# Patient Record
Sex: Male | Born: 1945
Health system: Southern US, Community
[De-identification: ages and names within clinical notes are randomized; demographics above are authoritative.]

## PROBLEM LIST (undated history)

## (undated) DIAGNOSIS — I2510 Atherosclerotic heart disease of native coronary artery without angina pectoris: Secondary | ICD-10-CM

## (undated) DIAGNOSIS — I255 Ischemic cardiomyopathy: Secondary | ICD-10-CM

## (undated) DIAGNOSIS — E119 Type 2 diabetes mellitus without complications: Secondary | ICD-10-CM

## (undated) DIAGNOSIS — I951 Orthostatic hypotension: Secondary | ICD-10-CM

## (undated) DIAGNOSIS — M542 Cervicalgia: Secondary | ICD-10-CM

## (undated) DIAGNOSIS — G8929 Other chronic pain: Secondary | ICD-10-CM

## (undated) DIAGNOSIS — I219 Acute myocardial infarction, unspecified: Secondary | ICD-10-CM

## (undated) DIAGNOSIS — E785 Hyperlipidemia, unspecified: Secondary | ICD-10-CM

## (undated) HISTORY — PX: CERVICAL SPINE SURGERY: SHX589

## (undated) HISTORY — DX: Orthostatic hypotension: I95.1

## (undated) HISTORY — DX: Atherosclerotic heart disease of native coronary artery without angina pectoris: I25.10

## (undated) HISTORY — DX: Hyperlipidemia, unspecified: E78.5

## (undated) HISTORY — DX: Type 2 diabetes mellitus without complications: E11.9

## (undated) HISTORY — DX: Ischemic cardiomyopathy: I25.5

## (undated) HISTORY — DX: Acute myocardial infarction, unspecified: I21.9

---

## 2002-05-31 ENCOUNTER — Ambulatory Visit (HOSPITAL_COMMUNITY): Admission: RE | Admit: 2002-05-31 | Discharge: 2002-05-31 | Payer: Self-pay | Admitting: Interventional Cardiology

## 2002-12-12 ENCOUNTER — Emergency Department (HOSPITAL_COMMUNITY): Admission: EM | Admit: 2002-12-12 | Discharge: 2002-12-12 | Payer: Self-pay | Admitting: Emergency Medicine

## 2002-12-12 ENCOUNTER — Encounter: Payer: Self-pay | Admitting: Emergency Medicine

## 2003-03-15 ENCOUNTER — Ambulatory Visit (HOSPITAL_COMMUNITY): Admission: RE | Admit: 2003-03-15 | Discharge: 2003-03-15 | Payer: Self-pay | Admitting: Gastroenterology

## 2003-03-15 ENCOUNTER — Encounter (INDEPENDENT_AMBULATORY_CARE_PROVIDER_SITE_OTHER): Payer: Self-pay | Admitting: Specialist

## 2003-05-30 ENCOUNTER — Encounter: Payer: Self-pay | Admitting: Emergency Medicine

## 2003-05-30 ENCOUNTER — Emergency Department (HOSPITAL_COMMUNITY): Admission: EM | Admit: 2003-05-30 | Discharge: 2003-05-30 | Payer: Self-pay | Admitting: Emergency Medicine

## 2003-10-03 ENCOUNTER — Ambulatory Visit (HOSPITAL_COMMUNITY): Admission: RE | Admit: 2003-10-03 | Discharge: 2003-10-03 | Payer: Self-pay | Admitting: Cardiology

## 2003-10-12 ENCOUNTER — Ambulatory Visit (HOSPITAL_COMMUNITY): Admission: RE | Admit: 2003-10-12 | Discharge: 2003-10-13 | Payer: Self-pay | Admitting: Cardiology

## 2004-07-03 ENCOUNTER — Ambulatory Visit (HOSPITAL_COMMUNITY): Admission: RE | Admit: 2004-07-03 | Discharge: 2004-07-04 | Payer: Self-pay | Admitting: Cardiology

## 2006-02-15 ENCOUNTER — Encounter: Admission: RE | Admit: 2006-02-15 | Discharge: 2006-02-15 | Payer: Self-pay | Admitting: Cardiology

## 2006-02-22 ENCOUNTER — Ambulatory Visit (HOSPITAL_COMMUNITY): Admission: RE | Admit: 2006-02-22 | Discharge: 2006-02-22 | Payer: Self-pay | Admitting: Cardiology

## 2007-04-01 ENCOUNTER — Encounter: Admission: RE | Admit: 2007-04-01 | Discharge: 2007-04-01 | Payer: Self-pay | Admitting: Cardiology

## 2007-04-06 ENCOUNTER — Ambulatory Visit (HOSPITAL_COMMUNITY): Admission: RE | Admit: 2007-04-06 | Discharge: 2007-04-06 | Payer: Self-pay | Admitting: Interventional Cardiology

## 2007-08-14 ENCOUNTER — Inpatient Hospital Stay (HOSPITAL_COMMUNITY): Admission: EM | Admit: 2007-08-14 | Discharge: 2007-08-17 | Payer: Self-pay | Admitting: Emergency Medicine

## 2007-08-14 ENCOUNTER — Ambulatory Visit: Payer: Self-pay | Admitting: *Deleted

## 2007-08-16 ENCOUNTER — Encounter: Payer: Self-pay | Admitting: Cardiovascular Disease

## 2007-10-11 ENCOUNTER — Ambulatory Visit: Payer: Self-pay | Admitting: Cardiovascular Disease

## 2007-10-13 ENCOUNTER — Ambulatory Visit: Payer: Self-pay | Admitting: Cardiovascular Disease

## 2007-10-13 ENCOUNTER — Ambulatory Visit: Payer: Self-pay

## 2007-11-09 ENCOUNTER — Ambulatory Visit: Payer: Self-pay | Admitting: Cardiovascular Disease

## 2008-02-14 ENCOUNTER — Ambulatory Visit: Payer: Self-pay | Admitting: Cardiovascular Disease

## 2008-04-27 ENCOUNTER — Ambulatory Visit: Payer: Self-pay | Admitting: Cardiovascular Disease

## 2008-04-27 LAB — CONVERTED CEMR LAB
ALT: 40 units/L (ref 0–53)
AST: 28 units/L (ref 0–37)
Alkaline Phosphatase: 55 units/L (ref 39–117)
Bilirubin, Direct: 0.1 mg/dL (ref 0.0–0.3)
Cholesterol: 105 mg/dL (ref 0–200)
Total Bilirubin: 0.8 mg/dL (ref 0.3–1.2)
Total Protein: 7.2 g/dL (ref 6.0–8.3)
VLDL: 10 mg/dL (ref 0–40)

## 2008-05-29 ENCOUNTER — Ambulatory Visit: Payer: Self-pay | Admitting: Cardiovascular Disease

## 2008-10-02 ENCOUNTER — Ambulatory Visit: Payer: Self-pay | Admitting: Cardiovascular Disease

## 2008-10-02 LAB — CONVERTED CEMR LAB
Albumin: 4.6 g/dL (ref 3.5–5.2)
Alkaline Phosphatase: 67 units/L (ref 39–117)
BUN: 11 mg/dL (ref 6–23)
Bilirubin, Direct: 0.2 mg/dL (ref 0.0–0.3)
Calcium: 10 mg/dL (ref 8.4–10.5)
Eosinophils Absolute: 0.2 10*3/uL (ref 0.0–0.7)
GFR calc Af Amer: 97 mL/min
GFR calc non Af Amer: 80 mL/min
Glucose, Bld: 120 mg/dL — ABNORMAL HIGH (ref 70–99)
HCT: 44 % (ref 39.0–52.0)
Hemoglobin: 15.1 g/dL (ref 13.0–17.0)
MCHC: 34.4 g/dL (ref 30.0–36.0)
MCV: 96.2 fL (ref 78.0–100.0)
Monocytes Absolute: 0.4 10*3/uL (ref 0.1–1.0)
Monocytes Relative: 7.1 % (ref 3.0–12.0)
Neutro Abs: 3.9 10*3/uL (ref 1.4–7.7)
Platelets: 180 10*3/uL (ref 150–400)
Potassium: 5 meq/L (ref 3.5–5.1)
RDW: 12.2 % (ref 11.5–14.6)
Sodium: 141 meq/L (ref 135–145)
Total Protein: 7.4 g/dL (ref 6.0–8.3)

## 2008-10-04 ENCOUNTER — Ambulatory Visit: Payer: Self-pay | Admitting: Cardiovascular Disease

## 2008-10-04 ENCOUNTER — Inpatient Hospital Stay (HOSPITAL_BASED_OUTPATIENT_CLINIC_OR_DEPARTMENT_OTHER): Admission: RE | Admit: 2008-10-04 | Discharge: 2008-10-04 | Payer: Self-pay | Admitting: Cardiovascular Disease

## 2008-10-04 HISTORY — PX: CARDIAC CATHETERIZATION: SHX172

## 2008-10-23 ENCOUNTER — Ambulatory Visit: Payer: Self-pay | Admitting: Cardiovascular Disease

## 2009-01-30 ENCOUNTER — Encounter (INDEPENDENT_AMBULATORY_CARE_PROVIDER_SITE_OTHER): Payer: Self-pay | Admitting: *Deleted

## 2009-04-02 ENCOUNTER — Ambulatory Visit: Payer: Self-pay | Admitting: Cardiovascular Disease

## 2009-04-03 ENCOUNTER — Encounter: Payer: Self-pay | Admitting: Cardiovascular Disease

## 2009-04-29 ENCOUNTER — Ambulatory Visit: Payer: Self-pay | Admitting: Cardiovascular Disease

## 2009-04-29 DIAGNOSIS — I2510 Atherosclerotic heart disease of native coronary artery without angina pectoris: Secondary | ICD-10-CM | POA: Insufficient documentation

## 2009-04-29 DIAGNOSIS — E785 Hyperlipidemia, unspecified: Secondary | ICD-10-CM | POA: Insufficient documentation

## 2009-04-29 DIAGNOSIS — Z9861 Coronary angioplasty status: Secondary | ICD-10-CM

## 2009-07-02 ENCOUNTER — Encounter: Payer: Self-pay | Admitting: Cardiovascular Disease

## 2009-07-11 ENCOUNTER — Encounter (INDEPENDENT_AMBULATORY_CARE_PROVIDER_SITE_OTHER): Payer: Self-pay | Admitting: *Deleted

## 2009-07-12 ENCOUNTER — Encounter (INDEPENDENT_AMBULATORY_CARE_PROVIDER_SITE_OTHER): Payer: Self-pay | Admitting: *Deleted

## 2009-09-26 ENCOUNTER — Ambulatory Visit: Payer: Self-pay | Admitting: Cardiovascular Disease

## 2009-11-18 ENCOUNTER — Encounter: Payer: Self-pay | Admitting: Cardiovascular Disease

## 2010-01-29 ENCOUNTER — Telehealth: Payer: Self-pay | Admitting: Cardiovascular Disease

## 2010-01-29 ENCOUNTER — Ambulatory Visit: Payer: Self-pay | Admitting: Cardiology

## 2010-01-29 ENCOUNTER — Observation Stay (HOSPITAL_COMMUNITY): Admission: EM | Admit: 2010-01-29 | Discharge: 2010-01-30 | Payer: Self-pay | Admitting: Emergency Medicine

## 2010-02-24 ENCOUNTER — Encounter: Payer: Self-pay | Admitting: Cardiovascular Disease

## 2010-02-25 ENCOUNTER — Ambulatory Visit: Payer: Self-pay | Admitting: Cardiology

## 2010-02-25 ENCOUNTER — Ambulatory Visit: Payer: Self-pay

## 2010-02-25 ENCOUNTER — Ambulatory Visit (HOSPITAL_COMMUNITY): Admission: RE | Admit: 2010-02-25 | Discharge: 2010-02-25 | Payer: Self-pay | Admitting: Cardiovascular Disease

## 2010-02-25 ENCOUNTER — Ambulatory Visit: Payer: Self-pay | Admitting: Cardiovascular Disease

## 2010-03-26 ENCOUNTER — Ambulatory Visit: Payer: Self-pay | Admitting: Cardiovascular Disease

## 2010-04-15 ENCOUNTER — Encounter: Payer: Self-pay | Admitting: Cardiovascular Disease

## 2010-04-16 ENCOUNTER — Ambulatory Visit: Payer: Self-pay | Admitting: Cardiovascular Disease

## 2010-04-16 ENCOUNTER — Ambulatory Visit: Payer: Self-pay

## 2010-05-01 ENCOUNTER — Ambulatory Visit: Payer: Self-pay | Admitting: Cardiovascular Disease

## 2010-09-11 ENCOUNTER — Ambulatory Visit
Admission: RE | Admit: 2010-09-11 | Discharge: 2010-09-11 | Payer: Self-pay | Source: Home / Self Care | Attending: Cardiovascular Disease | Admitting: Cardiovascular Disease

## 2010-09-11 ENCOUNTER — Encounter: Payer: Self-pay | Admitting: Cardiovascular Disease

## 2010-09-20 ENCOUNTER — Encounter: Payer: Self-pay | Admitting: Family Medicine

## 2010-09-28 LAB — CONVERTED CEMR LAB
AST: 30 units/L (ref 0–37)
Albumin: 5 g/dL (ref 3.5–5.2)
Alkaline Phosphatase: 76 units/L (ref 39–117)
Cholesterol: 158 mg/dL (ref 0–200)
HDL: 36.7 mg/dL — ABNORMAL LOW (ref >39.00)
Total CHOL/HDL Ratio: 4
Total Protein: 8 g/dL (ref 6.0–8.3)
Triglycerides: 127 mg/dL (ref 0.0–149.0)
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
## 2010-09-30 NOTE — Miscellaneous (Signed)
  Clinical Lists Changes  Observations: Added new observation of CXR RESULTS:  Heart and mediastinal contours normal.  Lungs clear.   Osseous structures intact in one-view.    IMPRESSION:   No active disease in one-view.  (01/29/2010 10:49)      CXR  Procedure date:  01/29/2010  Findings:       Heart and mediastinal contours normal.  Lungs clear.   Osseous structures intact in one-view.    IMPRESSION:   No active disease in one-view.

## 2010-09-30 NOTE — Assessment & Plan Note (Signed)
Summary: EPH   Visit Type:  Post-hospital Primary Provider:  Joycelyn Rua, MD  CC:  No complaints.  History of Present Illness: 65 year-old male with CAD and hx MI secondary to RCA stent thrombosis. He was hospitalized recently with an episode of chest pain, but had normal cardiac markers and serial EKG's. He was observed overnight and discharged home the following day with outpatient followup today. He has felt well since discharge and denies CP, dyspnea, edema, or palps. He has been walking 3 miles daily at a brisk pace without exertional symptoms.  Current Medications (verified): 1)  Tra2p Study Drug .... Take One Tab Once Daily 2)  Niaspan 750 Mg Cr-Tabs (Niacin (Antihyperlipidemic)) .... Take 2 Tablets At Bed Time 3)  Aspirin 325 Mg Tabs (Aspirin) .Marland Kitchen.. 1 Once Daily 4)  Plavix 75 Mg Tabs (Clopidogrel Bisulfate) .Marland Kitchen.. 1 Once Daily 5)  Omega-3 350 Mg Caps (Omega-3 Fatty Acids) .Marland Kitchen.. 10-12 Grams Daily 6)  Zocor 40 Mg Tabs (Simvastatin) .... Take 1/2  Tablet By Mouth Once A Day  Allergies (verified): No Known Drug Allergies  Past History:  Past medical history reviewed for relevance to current acute and chronic problems.  Past Medical History: Reviewed history from 04/29/2009 and no changes required. Myocardial Infarction 09/11/06 CAD Hyperlipidemia  Review of Systems       Negative except as per HPI   Vital Signs:  Patient profile:   65 year old male Height:      72 inches Weight:      170.50 pounds BMI:     23.21 Pulse rate:   76 / minute Pulse rhythm:   regular Resp:     18 per minute BP sitting:   104 / 70  (left arm) Cuff size:   large  Vitals Entered By: Vikki Ports (February 25, 2010 11:20 AM)  Physical Exam  General:  Pt is alert and oriented, in no acute distress. HEENT: normal Neck: normal carotid upstrokes without bruits, JVP normal Lungs: CTA CV: RRR without murmur or gallop Abd: soft, NT, positive BS, no bruit, no organomegaly Ext: no clubbing,  cyanosis, or edema. peripheral pulses 2+ and equal Skin: warm and dry without rash    EKG  Procedure date:  02/25/2010  Findings:      NSR, within normal limits, HR 76 bpm.  Impression & Recommendations:  Problem # 1:  CORONARY ATHEROSCLEROSIS NATIVE CORONARY ARTERY (ICD-414.01) Pt stable without further episodes of chest pain. He had a similar episode last year and we did a relook cath showing stable coronary anatomy. Continue long-term dual antiplatelet Rx with ASA and plavix since he has history of late stent thrombosis. I applauded him for his excellent exercise regimen.  His updated medication list for this problem includes:    Aspirin 325 Mg Tabs (Aspirin) .Marland Kitchen... 1 once daily    Plavix 75 Mg Tabs (Clopidogrel bisulfate) .Marland Kitchen... 1 once daily  Orders: EKG w/ Interpretation (93000) TLB-Lipid Panel (80061-LIPID) TLB-Hepatic/Liver Function Pnl (80076-HEPATIC)  Problem # 2:  PURE HYPERCHOLESTEROLEMIA (ICD-272.0) WIll check lipids today. Simvastatin was reduced in the hospital because of concomitant Niaspan.  His updated medication list for this problem includes:    Niaspan 750 Mg Cr-tabs (Niacin (antihyperlipidemic)) .Marland Kitchen... Take 2 tablets at bed time    Zocor 40 Mg Tabs (Simvastatin) .Marland Kitchen... Take 1/2  tablet by mouth once a day  Orders: EKG w/ Interpretation (93000) TLB-Lipid Panel (80061-LIPID) TLB-Hepatic/Liver Function Pnl (80076-HEPATIC)  CHOL: 105 (04/27/2008)   LDL: 68 (04/27/2008)   HDL:  27.7 (04/27/2008)   TG: 49 (04/27/2008)  Patient Instructions: 1)  Your physician recommends that you have a LIPID and LIVER Profile today.  2)  Your physician recommends that you continue on your current medications as directed. Please refer to the Current Medication list given to you today. 3)  Your physician wants you to follow-up in:   6 MONTHS. You will receive a reminder letter in the mail two months in advance. If you don't receive a letter, please call our office to schedule the  follow-up appointment.

## 2010-09-30 NOTE — Letter (Signed)
Summary: [facility] Smart Care Consideration  [facility] Smart Care Consideration   Imported By: Roderic Ovens 02/04/2010 12:24:40  _____________________________________________________________________  External Attachment:    Type:   Image     Comment:   External Document

## 2010-09-30 NOTE — Miscellaneous (Signed)
Summary: Orders Update  Clinical Lists Changes  Orders: Added new Test order of Arterial Duplex Lower Extremity (Arterial Duplex Low) - Signed 

## 2010-10-02 NOTE — Assessment & Plan Note (Signed)
Summary: f42m   Visit Type:  Follow-up Primary Provider:  Joycelyn Rua, MD  CC:  No complaints.  History of Present Illness: 65 year-old male with CAD and hx MI secondary to RCA stent thrombosis. He has subsequently done well on continued dual antiplatelet Rx without recurrent events. He had a normal stress echo February 25, 2010.  From June 2011 through today, he has walked 603 miles, 3,086,578 steps with exercise. He walks about 75 minutes daily.  The patient denies chest pain, dyspnea, orthopnea, PND, edema, palpitations, lightheadedness, or syncope.   Current Medications (verified): 1)  Tra2p Study Drug .... Take One Tab Once Daily 2)  Niaspan 750 Mg Cr-Tabs (Niacin (Antihyperlipidemic)) .... Take 2 Tablets At Bed Time 3)  Aspirin 325 Mg Tabs (Aspirin) .Marland Kitchen.. 1 Once Daily 4)  Plavix 75 Mg Tabs (Clopidogrel Bisulfate) .Marland Kitchen.. 1 Once Daily 5)  Omega-3 350 Mg Caps (Omega-3 Fatty Acids) .Marland Kitchen.. 10-12 Grams Daily 6)  Zocor 40 Mg Tabs (Simvastatin) .... Take 1/2  Tablet By Mouth Once A Day  Allergies (verified): No Known Drug Allergies  Past History:  Past medical history reviewed for relevance to current acute and chronic problems.  Past Medical History: Reviewed history from 04/29/2009 and no changes required. Myocardial Infarction 09/11/06 CAD Hyperlipidemia  Review of Systems       Negative except as per HPI   Vital Signs:  Patient profile:   65 year old male Height:      72 inches Weight:      166.25 pounds BMI:     22.63 Pulse rate:   66 / minute Pulse rhythm:   regular Resp:     18 per minute BP sitting:   110 / 68  (left arm) Cuff size:   large  Vitals Entered By: Vikki Ports (September 11, 2010 10:36 AM)  Physical Exam  General:  Pt is alert and oriented, in no acute distress. HEENT: normal Neck: normal carotid upstrokes without bruits, JVP normal Lungs: CTA CV: RRR without murmur or gallop Abd: soft, NT, positive BS, no bruit, no organomegaly Ext: no  clubbing, cyanosis, or edema. peripheral pulses 2+ and equal Skin: warm and dry without rash    Stress Echocardiogram  Procedure date:  02/25/2010  Findings:      Study Conclusions  - Stress ECG conclusions: The stress ECG was normal. - Baseline: LV global systolic function was normal. The estimated LV   ejection fraction was 65%. Normal wall motion; no LV regional wall   motion abnormalities. - Peak stress: LV global systolic function was hyperdynamic. Normal   wall motion; no LV regional wall motion abnormalities. - Impressions: With stress there is increased thickening and   contractility in all segments. There is decreased cavity size.   This is a normal stress echo study. Impressions:  - With stress there is increased thickening and contractility in all   segments. There is decreased cavity size. This is a normal stress   echo study.  EKG  Procedure date:  02/25/2010  Findings:      NSR, within normal limits, HR 66 bpm.  Impression & Recommendations:  Problem # 1:  CORONARY ATHEROSCLEROSIS NATIVE CORONARY ARTERY (ICD-414.01)  Stable without angina at an excellent workload. Continue current medical program with lifelong plavix and risk reduction measures as outlined.  His updated medication list for this problem includes:    Aspirin 325 Mg Tabs (Aspirin) .Marland Kitchen... 1 once daily    Plavix 75 Mg Tabs (Clopidogrel bisulfate) .Marland Kitchen... 1 once  daily  Orders: EKG w/ Interpretation (93000)  Problem # 2:  PURE HYPERCHOLESTEROLEMIA (ICD-272.0)  Pt going to consider the REVEAL trial which looks at HDL raising therapy.  His updated medication list for this problem includes:    Niaspan 750 Mg Cr-tabs (Niacin (antihyperlipidemic)) .Marland Kitchen... Take 2 tablets at bed time    Zocor 40 Mg Tabs (Simvastatin) .Marland Kitchen... Take 1/2  tablet by mouth once a day  CHOL: 158 (02/25/2010)   LDL: 96 (02/25/2010)   HDL: 36.70 (02/25/2010)   TG: 127.0 (02/25/2010)  Orders: EKG w/ Interpretation  (93000)  Patient Instructions: 1)  Your physician recommends that you continue on your current medications as directed. Please refer to the Current Medication list given to you today. 2)  Your physician wants you to follow-up in:  1 YEAR.  You will receive a reminder letter in the mail two months in advance. If you don't receive a letter, please call our office to schedule the follow-up appointment.

## 2010-11-17 LAB — POCT I-STAT, CHEM 8
HCT: 41 % (ref 39.0–52.0)
Hemoglobin: 13.9 g/dL (ref 13.0–17.0)
Potassium: 4.4 mEq/L (ref 3.5–5.1)
Sodium: 137 mEq/L (ref 135–145)
TCO2: 24 mmol/L (ref 0–100)

## 2010-11-17 LAB — COMPREHENSIVE METABOLIC PANEL
ALT: 33 U/L (ref 0–53)
Albumin: 3.9 g/dL (ref 3.5–5.2)
Alkaline Phosphatase: 66 U/L (ref 39–117)
BUN: 11 mg/dL (ref 6–23)
Calcium: 9.2 mg/dL (ref 8.4–10.5)
Potassium: 4.6 mEq/L (ref 3.5–5.1)
Sodium: 141 mEq/L (ref 135–145)
Total Protein: 6.4 g/dL (ref 6.0–8.3)

## 2010-11-17 LAB — POCT CARDIAC MARKERS
CKMB, poc: 1.2 ng/mL (ref 1.0–8.0)
Troponin i, poc: <0.05 ng/mL (ref 0.00–0.09)

## 2010-11-17 LAB — CBC
MCHC: 34.5 g/dL (ref 30.0–36.0)
MCV: 98.2 fL (ref 78.0–100.0)
Platelets: 174 10*3/uL (ref 150–400)
RDW: 12.9 % (ref 11.5–15.5)

## 2010-11-17 LAB — CARDIAC PANEL(CRET KIN+CKTOT+MB+TROPI)
CK, MB: 1.8 ng/mL (ref 0.3–4.0)
Relative Index: INVALID (ref 0.0–2.5)
Total CK: 82 U/L (ref 7–232)

## 2010-11-17 LAB — TSH: TSH: 3.997 u[IU]/mL (ref 0.350–4.500)

## 2010-11-17 LAB — DIFFERENTIAL
Basophils Relative: 1 % (ref 0–1)
Eosinophils Absolute: 0.1 10*3/uL (ref 0.0–0.7)
Neutrophils Relative %: 79 % — ABNORMAL HIGH (ref 43–77)

## 2010-11-17 LAB — LIPID PANEL
HDL: 28 mg/dL — ABNORMAL LOW (ref >39)
LDL Cholesterol: 58 mg/dL (ref 0–99)
Triglycerides: 112 mg/dL (ref <150)

## 2010-11-17 LAB — HEMOGLOBIN A1C: Mean Plasma Glucose: 134 mg/dL — ABNORMAL HIGH (ref <117)

## 2010-11-24 ENCOUNTER — Other Ambulatory Visit: Payer: Self-pay | Admitting: Cardiovascular Disease

## 2010-11-27 NOTE — Telephone Encounter (Signed)
[address] °

## 2010-11-29 ENCOUNTER — Other Ambulatory Visit: Payer: Self-pay | Admitting: *Deleted

## 2010-11-29 MED ORDER — SIMVASTATIN 40 MG PO TABS: 40.0000 mg | ORAL_TABLET | Freq: Every day | ORAL | Status: DC

## 2010-12-29 ENCOUNTER — Telehealth: Payer: Self-pay | Admitting: Cardiovascular Disease

## 2010-12-29 NOTE — Telephone Encounter (Signed)
Pt needs to know what he needs to do re his plavix pt is waiting to have dental work done.

## 2010-12-29 NOTE — Telephone Encounter (Signed)
I spoke with the pt and made him aware that we do not hold plavix prior to dental work.  The pt is trying to  schedule a dental extraction.  The pt saw the dentist last week on Thursday and per the pt paperwork was faxed to our office.  I made the pt aware that at this time I have not received any documentation.  The pt can proceed with extraction and can continue plavix.

## 2010-12-31 ENCOUNTER — Telehealth: Payer: Self-pay

## 2010-12-31 NOTE — Telephone Encounter (Signed)
With the patient's history of stent thrombosis I would recommend staying on both aspirin and Plavix without interruption for dental extraction.

## 2010-12-31 NOTE — Telephone Encounter (Signed)
I spoke with Angelica Chessman at Dr Mendel Corning office. They need to perform a dental extraction of lower molar.  This pt is taking both ASA and Plavix.  They would like to know if the pt can hold one of these medications and how long to hold per Dr Excell Seltzer.  They need note faxed to [phone]. I will forward this message to Dr Excell Seltzer.

## 2010-12-31 NOTE — Telephone Encounter (Signed)
Telephone note faxed

## 2011-01-05 ENCOUNTER — Telehealth: Payer: Self-pay | Admitting: Cardiology

## 2011-01-05 NOTE — Telephone Encounter (Signed)
This is Dr Cooper's patient, not Dr Alford Highland. 12/31/10 telephone note refaxed.

## 2011-01-05 NOTE — Telephone Encounter (Signed)
Please re-fax clearance over to fax # [phone]. Surgery on tomorrow.

## 2011-01-13 NOTE — Cardiovascular Report (Signed)
NAMEMORRILL, BOMKAMP NO.:  [id number]   MEDICAL RECORD NO.:  [id number]          PATIENT TYPE:  OIB   LOCATION:  2859                         FACILITY:  [facility]   PHYSICIAN:  Corky Crafts, MDDATE OF BIRTH:  [birth year]   DATE OF PROCEDURE:  DATE OF DISCHARGE:                            CARDIAC CATHETERIZATION   PROCEDURES:  Left heart catheterization, left ventriculogram, coronary  angiogram, abdominal aortogram.   OPERATOR:  Dr. Eldridge Dace   INDICATIONS:  Abnormal stress test and chest pain.   PROCEDURE:  The risks and benefits of cardiac catheterization were  explained to the patient and informed consent was obtained.  The patient  was brought to the cath lab.  He was prepped and draped in the usual  sterile fashion.  His right groin was infiltrated with 1% lidocaine.  A  6-French arterial sheath was placed into his right femoral artery using  modified Seldinger technique.  Left coronary artery angiography was  attempted with a JL-4.0 catheter.  Upon engaging the left main, pressure  damping was evident, a nonselective shot was taken showing no stenosis.  Then the right coronary was then visualized using a JR-4.0 catheter.  The catheter was then advanced to the vessel of the ostium under  fluoroscopic guidance.  Digital angiography was performed in multiple  projections using hand injection contrast.  The left system was then  visualized with a JL-4.0 guiding catheter with side holes.  The pressure  remained more stable.  The patient did not have any symptoms.  A pigtail  catheter was then advanced to the ascending aorta and across the aortic  valve into the left ventricle.  Power injection of contrast was  performed in the RAO projection.  The catheter was then pulled back  under continuous hemodynamic pressure monitoring.  The catheter was  pulled back to the abdominal aorta and a power injection of contrast was  performed in the AP projection.  The  sheath was removed using a Star  close to obtain hemostasis.   FINDINGS:  Left main was widely patent.  The left circumflex was a  medium-sized vessel which was widely patent.  The OM-1 was medium-sized  mild irregularities.  OM-2 was small but patent.  The left anterior  descending was a large vessel with mild luminal irregularities.  The  first diagonal was a large vessel with mild irregularities.  The second  diagonal was a small vessel and widely patent.  The right coronary  artery was a large dominant vessel.  There is a widely patent proximal  to mid RCA stent.  There is a large PDA in which the proximal portion is  stented.  There is a 50% focal area of in-stent restenosis.  The PDA  that does go on to supply blood flow to the apex.  The left  ventriculogram shows normal ventricular function with ejection fraction  of 60%.  The abdominal aortogram shows no abdominal aortic aneurysm.  There are single renal arteries bilaterally which appear widely patent.   HEMODYNAMIC RESULTS:  Left ventricular pressure of 113/3 with an LVEDP  of 9 mmHg.  Aortic pressure 113/67 with mean aortic pressure of 87 mmHg.   IMPRESSION:  1. Patent left coronary system, patent right coronary artery stent,      patent PDA stent with a focal area of 50% restenosis.  This appears      unchanged from the prior catheterization.  2. Normal ventricular contraction.  3. Normal hemodynamics.  4. No renal artery stenosis.  No abdominal aortic aneurysm.   RECOMMENDATIONS:  The patient will continue with aspirin and statin,  along with other secondary prevention.  He will follow up with Dr.  Mayford Knife.      Corky Crafts, MD  Electronically Signed     JSV/MEDQ  D:  04/06/2007  T:  04/06/2007  Job:  [id number]

## 2011-01-13 NOTE — Cardiovascular Report (Signed)
[name], Jack NO.:  [id number]   MEDICAL RECORD NO.:  [id number]          PATIENT TYPE:  OIB   LOCATION:  1962                         FACILITY:  [facility]   PHYSICIAN:  Veverly Fells. Excell Seltzer, MD  DATE OF BIRTH:  04-22-1946   DATE OF PROCEDURE:  10/04/2008  DATE OF DISCHARGE:  10/04/2008                            CARDIAC CATHETERIZATION   PROCEDURES:  1. Left heart catheterization.  2. Selective coronary angiography.  3. Left ventricular angiography.   INDICATION:  [name] is a 65 year old gentleman with coronary artery  disease.  He has undergone prior stenting of the right coronary artery  and right PDA.  He presented with stent thrombosis in December 2008 and  was treated with balloon angioplasty.  He has been followed since that  time and has done well.  However, he presented earlier this week with  typical symptoms of marked exertional angina.  He was referred for  cardiac catheterization because of his high pretest probability.   Risks and indications of the procedure were reviewed with the patient.  Informed consent was obtained.  The right groin was prepped, draped, and  anesthetized with 1% lidocaine.  Using the modified Seldinger technique,  a 4-French sheath was placed in the right femoral artery.  Standard 4-  French Judkins catheters were used for coronary angiography and left  ventriculography.  A pullback across the aortic valve was performed.  The patient tolerated the procedure well.  There were no immediate  complications.  All catheter exchanges were performed over a guidewire.   FINDINGS:  Aortic pressure 100/57 with a mean of 74, left ventricular  pressure 101/17.   CORONARY ANGIOGRAPHY:  Left mainstem:  The left mainstem is small.  It  has minor ostial stenosis of approximately 30%.  The left main divides  into the LAD and left circumflex.   LAD:  The LAD is a moderate-sized vessel.  It courses down and reaches  the LV apex.  There  is minor nonobstructive plaque in the mid vessel  producing no more than a 30% stenosis.  The first diagonal branch is  relatively large diagonal.  There is minor luminal irregularity but no  significant stenosis present.  There is mild bridging of the mid LAD.   Left circumflex:  The left circumflex supplies an intermediate branch  with no significant stenosis.  It then courses down and supplies a small  OM-1 branch and very small OM-2 and 3 branches.  There is no significant  stenosis in the left circumflex.   Right coronary artery:  The right coronary artery has overlapping stents  in the proximal vessel.  They are widely patent.  The proximal stented  segment has a small aneurysmal area.  The midportion of the stented  segment has mild 25% restenosis.  This appears smooth.  There are no  filling defects in the stent.  Beyond the stented segment, there is  nonobstructive plaque noted in the mid vessel.  The right coronary  artery then courses down and divides into a PDA branch and a large  posterolateral branch.  There is a stent in the PDA branch that is  patent.  There is a focal area of 50% restenosis in the PDA.  This was  compared to previous studies and it is unchanged.  The remaining  portions of the PDA are widely patent.  The posterior AV segment gives  off a large first posterolateral and a small second posterolateral  branch.  There is nonobstructive plaque in the posterior AV segment and  no significant stenosis in the posterolateral branch.   Left ventriculography shows normal LV function, EF is 60% by visual  estimate.   ASSESSMENT:  1. Widely patent proximal right coronary artery stent.  2. Mild to moderate restenosis in the right posterior descending      artery stent.  3. Nonobstructive left coronary artery stenosis.  4. Normal left ventricular function.   PLAN:  [name] has stable anatomy.  I am pleased that the stents in  the right coronary artery remain  patent.  I recommend continued medical  management of his coronary artery disease.      Veverly Fells. Excell Seltzer, MD  Electronically Signed     MDC/MEDQ  D:  10/04/2008  T:  10/04/2008  Job:  [id number]   cc:   Joycelyn Rua, M.D.

## 2011-01-13 NOTE — Assessment & Plan Note (Signed)
[facility]                            CARDIOLOGY OFFICE NOTE   NAME:[name], [name]                         MRN:          [id number]  DATE:10/02/2008                            DOB:          [birth year]    REASON FOR VISIT:  Chest pain.   HISTORY OF PRESENT ILLNESS:  Mr. Leighty is a 65 year old gentleman with  coronary artery disease who had an inferior wall MI approximately 2  years ago that occurred secondary to stent thrombosis.  He underwent  balloon angioplasty to reopen his right coronary artery and he has done  well since that time.  However, he reports a recent episode of profound  fatigue associated with chest discomfort.  It occurred while he was  carrying wood in from outside.  He became extremely fatigued and  developed chest discomfort as well as nausea.  He also had associated  shortness of breath.  He had to call his wife to help him back into the  house.  He has had no other similar episodes and he was doing fairly  vigorous work at that time.  He also describes pain under the left arm  that occurs mainly when he drives.  He was concerned that this could  represent heart pain as well.  When this occurs, he experiences numbness  in his fingertips.  He has complained of increased fatigue over the last  few months as well.  He also has increased exertional dyspnea over the  same time period.   MEDICATIONS:  1. Aspirin 325 mg daily.  2. Plavix 75 mg daily.  3. Zocor 80 mg at bedtime.  4. Omega-3 10-12 g daily.  5. Niaspan time-release 1500 mg daily.   ALLERGIES:  NKDA.   PAST MEDICAL HISTORY  1. Coronary artery disease as outlined.  He initially underwent      stenting of the right coronary artery in 2007 and subsequently      suffered an inferior wall MI in 2008 secondary to late stent      thrombosis.  2. Dyslipidemia.  3. Mild left ventricular dysfunction consistent with prior myocardial      infarction.   PHYSICAL EXAM:   GENERAL:  The patient is alert and oriented.  He is in  no acute distress.  VITAL SIGNS:  Weight is 177 pounds, blood pressure 123/78, heart rate  75, respiratory rate 12.  HEENT:  Normal.  NECK:  Normal carotid upstrokes.  No bruits.  JVP normal.  No  thyromegaly or thyroid nodules.  LUNGS:  Clear bilaterally.  HEART:  Regular rate and rhythm.  No murmurs or gallops.  ABDOMEN:  Soft, nontender, no organomegaly.  No abdominal bruits.  BACK:  No CVA tenderness.  EXTREMITIES:  No clubbing, cyanosis, or edema.  Peripheral pulses are  intact and equal throughout.  SKIN:  Warm and dry without rash.   EKG shows normal sinus rhythm with age-indeterminate inferolateral MI.  No significant ST or T-wave changes.   ASSESSMENT:  1. Coronary artery disease with history of inferior myocardial      infarction.  Mr. Ahn had a profound episode concerning for      myocardial ischemia.  I think his pretest probability of recurrent      obstructive coronary artery disease is high and I have recommended      proceeding directly with cardiac catheterization.  He is very      concerned about his cardiac status as well and he agrees with this      plan.  He was scheduled for Thursday, October 05, 2007, in the      outpatient cath lab.  He will continue his same medications in the      interim.  Risks and indications of the procedure were reviewed in      detail with the patient he agreed to proceed.  2. Dyslipidemia.  Lipids drawn, April 27, 2008, showed a cholesterol      of 105, triglycerides 49, HDL 28, LDL 68.  His LFTs were normal.      He will be due for followup later this summer.   Further plans for office followup pending the results of his cardiac  catheterization.     Veverly Fells. Excell Seltzer, MD  Electronically Signed    MDC/MedQ  DD: 10/02/2008  DT: 10/03/2008  Job #: [id number]   cc:   Joycelyn Rua, M.D.

## 2011-01-13 NOTE — Cardiovascular Report (Signed)
[name], [name] NO.:  [id number]   MEDICAL RECORD NO.:  [id number]          PATIENT TYPE:  INP   LOCATION:  2907                         FACILITY:  [facility]   PHYSICIAN:  Veverly Fells. Excell Seltzer, MD  DATE OF BIRTH:  Jan 02, 1946   DATE OF PROCEDURE:  08/14/2007  DATE OF DISCHARGE:                            CARDIAC CATHETERIZATION   PROCEDURES:  Left heart catheterization, selective coronary angiography,  left ventricular angiography, percutaneous transluminal coronary  angiography and intravascular ultrasound of the right coronary artery  and Angio-Seal of the right femoral artery.   INDICATIONS:  Mr. Levitz is a 65 year old gentleman who developed  substernal chest pain with generalized fatigue late this afternoon.  He  called EMS and upon their arrival, he had inferolateral ST elevation  consistent with myocardial infarction.  A code STEMI was called and he  was brought by [facility].  Mr. Murtagh has known coronary artery  disease and multiple drug-eluting stents in his right coronary artery  and right PDA branch.  His stents were placed back in 2005.  He actually  underwent a diagnostic catheterization in August of this year that  showed widely patent stents.  He stopped his Plavix this summer, but was  on Plavix for 3 years following his initial PCI procedure.  Risks and  indications of the procedure were reviewed with the patient.  Informed  consent was obtained.  He was brought emergently to the cardiac  catheterization lab.   The right groin was prepped and draped under normal sterile conditions.  A 6-French sheath was placed in the right femoral artery and a 5-French  sheath was placed in the right femoral vein.  A 5-French, JL-4  diagnostic catheter was used to image the left coronary artery.  A 6-  [place], JR-4 guide catheter was used for the right coronary artery.  The  right coronary artery was occluded in its proximal portion in the  stented  segment.  There was a 100% occlusion in that area.  The patient  had received heparin in the emergency room.  He was preloaded with 600  mg of Plavix.  His initial ACT was 216.  Angiomax was used for  anticoagulation.  The lesion was wired with a Cougar guidewire without  difficulty and the wire was placed into the distal right coronary  artery.  It was predilated with a 2.5 x 20-mm, Maverick balloon to 10  atmospheres.  Following balloon dilatation, there was restoration of  TIMI-3 flow in the vessel.  There was a heavy thrombus burden as well as  distal thrombus in the posterior AV segment.  At that point,  I elected  to add Integrilin.  A double bolus and drip were used.  In the setting  of late stent thrombosis, I elected to perform intravascular ultrasound  to make sure that there was good stent apposition throughout.  The IVUS  catheter was inserted and an automated pullback was performed.  IVUS  demonstrated good stent apposition throughout with the exception of one  area in the proximal portion of the stented segment  where there was a  small outpouching vessel.  There was heavy thrombus burden throughout  the midportion of the right coronary artery and a long segment of stent.  I then elected to dilate that entire region with a 3 x 20-mm, Quantum  Maverick balloon.  This was taken to 20 atmospheres on three inflations.  The patient had 2.75-mm, Taxus stents in that area and with IVUS, the  stents appeared to be approximately 3-mm in diameter.  Following  dilatation, there was improvement in the area, but still significant  residual plaque and thrombus.  Another IVUS run was made that showed  patency throughout the stent with the exception of the midportion which  showed residual stenosis.  I dilated that area with a 3.25 x 12-mm,  Quantum Maverick up to 20 atmospheres on two inflations.  At the  completion of the procedure, there was improvement in that area with  only 10% residual  stenosis.  There was TIMI-3 flow.  The area of the  filling defect in the posterior AV segment had resolved with Integrilin.  A pigtail catheter was inserted into the left ventricle and  ventriculography was performed and pullback across the aortic valve was  done.  An Angio-Seal device was used to seal the femoral arteriotomy.  The patient tolerated the entire procedure well.  He had complete ST-  segment resolution at the completion of the procedure and TIMI-3 flow.  He was chest pain-free.  He was transferred to the CCU in guarded  condition.   FINDINGS:  Aortic pressure 100/60 with a mean of 78, left ventricular  pressure 102/21.   CORONARY ANGIOGRAPHY:  The left mainstem has 30% ostial stenosis with  mild calcification.  The left mainstem bifurcates into the LAD and left  circumflex.  The LAD is a medium caliber vessel with diffuse  nonobstructive disease.  There are two proximal diagonal branches  arising from the LAD that are patent.  The first diagonal has a 50%  stenosis in its proximal portion.  The LAD itself at the area of the  second septal perforator in the midportion of the vessel has a focal 30%  stenosis.  There is minor plaque throughout the remaining portions of  the LAD.  The LAD does not quite reach the apex as it tapers down in the  distal anterior wall.   The left circumflex has diffuse nonobstructive plaque.  It is a small  vessel.  It supplies a small OM-1 and two small left posterolateral  branches.   The right coronary artery is occluded in its proximal portion within the  stented segment.  It appears to be a flush occlusion.  The right  coronary artery following PCI is a dominant vessel that courses down and  supplies a PDA branch as well as a posterior AV segment and two right  posterolateral branches.  There is a patent stent in the PDA with 50%  stenosis at the distal aspect.  There are luminal irregularities  throughout the posterior AV segment and  posterolateral branch.   Left ventricular function shows a large area of akinesis in the mid and  distal inferior wall into the inferior apex.  The entire anterior wall  and basal inferior wall function normally.   ASSESSMENT:  1. Acute inferolateral myocardial infarction secondary to very late      stent thrombosis in the right coronary artery.  2. Nonobstructive plaque in the left main left anterior descending and  left circumflex.  3. Moderate segmental left ventricular dysfunction with an left      ventricular ejection fraction of 45%.   DISCUSSION:  As detailed above, PTCA of the right coronary artery is  performed under guidance of intravascular ultrasound.  There was mild  residual stenosis in the mid-right coronary artery, but there is TIMI-3  flow in the vessel.  The vessel was dilated with a 3.25-mm, Quantum  Maverick balloon.  Distally, in the vessel, there was resolution of  filling defects with the use an Integrilin.  I would recommend lifelong  aspirin and Plavix.  Would consider twice-daily Plavix for the first 30  days at the discretion of Dr. Mayford Knife.  The patient will be transferred  to the CCU and treated with routine post MI medical therapy.      Veverly Fells. Excell Seltzer, MD  Electronically Signed     MDC/MEDQ  D:  08/14/2007  T:  08/15/2007  Job:  [id number]

## 2011-01-13 NOTE — Assessment & Plan Note (Signed)
[name]                            CARDIOLOGY OFFICE NOTE   NAME:[name]                         MRN:          [id number]  DATE:04/27/2008                            DOB:          07/03/1946    [name] was seen in followup at the [facility] on  April 27, 2008 for followup of coronary artery disease and history of  myocardial infarction.  He is a 65 year old gentleman who had an  inferior MI in December 2008, secondary to stent thrombosis.  He had  undergone stenting of the right coronary artery back in 2005 with a drug-  eluting stent and had done well since that time.  He was off of Plavix  for approximately 6 months when his event occurred.  He was treated with  twice daily Plavix after his MI and now is back on aspirin and Plavix  and in conjunction.  He has no complaints today.  He feels well.  He  specifically denies chest pain, dyspnea, edema, lightheadedness,  palpitations, or fatigue.  He was tolerating his medications well.  He  has not been able to take beta-blockers or ACE inhibitors due to  symptomatic hypotension.   MEDICATIONS:  1. Aspirin 325 mg.  2. Plavix 75 mg.  3. Zocor 80 mg at bedtime.  4. Omega-3.  5. Niaspan 1500 mg daily.   ALLERGIES:  NKDA.   PHYSICAL EXAMINATION:  GENERAL:  The patient is alert and oriented.  He  is in no acute distress.  VITAL SIGNS:  Weight is 170 pounds, blood pressure is 120/68, heart rate  is 54, and respiratory rate is 16.  HEENT:  Normal.  NECK:  Normal carotid upstrokes.  No bruits.  JVP normal.  LUNGS:  Clear bilaterally.  HEART:  Bradycardic and regular with no murmurs or gallops.  ABDOMEN:  Soft and nontender.  No organomegaly.  EXTREMITIES:  No clubbing, cyanosis, or edema.  Peripheral pulses 2+ and  equal throughout.  SKIN:  Warm and dry.  No rash.   EKG shows sinus bradycardia, cannot rule out inferior MI agent, age-  indeterminate.  Otherwise, no  significant changes.   ASSESSMENT:  1. Coronary artery disease, status post inferior myocardial infarction      secondary to very late stent thrombosis.  The patient maintains on      dual antiplatelet therapy with aspirin and Plavix.  I had a      discussion with him about potentially changing antiplatelet therapy      to aspirin and prasugrel when this medication becomes available.      However, he has done well when he has been on combination of      aspirin and Plavix and at this point we will continue with that      plan.  He will need dual antiplatelet therapy lifelong.  2. Dyslipidemia, marked mainly by low HDL.  Lipids in February showed      a HDL of 27, total cholesterol 102, triglycerides 94, and LDL 56.      His CRP is also  markedly elevated at 12.  Continue aggressive risk      reduction and antihyperlipidemic treatment with combination of      Niaspan and high-dose Zocor.   For followup, I would like to see [name] back in 6 months.      [name]. Excell Seltzer, MD  Electronically Signed    MDC/MedQ  DD: 04/27/2008  DT: 04/28/2008  Job #: [id number]   cc:   Joycelyn Rua, M.D.

## 2011-01-13 NOTE — Assessment & Plan Note (Signed)
[facility]                            CARDIOLOGY OFFICE NOTE   NAME:[name], [name]                         MRN:          [id number]  DATE:10/23/2008                            DOB:          1946-01-27    REASON FOR VISIT:  Followup cardiac catheterization.   HISTORY OF PRESENT ILLNESS:  Mr. Catalfamo is a 65 year old gentleman with  coronary artery disease who had an inferior wall MI in December 2008  secondary to stent thrombosis.  He has done well since that time.  He  was seen in early February after an episode of severe chest pain and  profound fatigue that occurred with heavy physical exertion.  With his  history of coronary artery disease and myocardial infarction, he was  referred for cardiac catheterization to rule out recurrent obstructive  CAD.  This was performed on October 04, 2008 and demonstrated widely  patent coronary stents with no significant obstructive disease.  His  left ventricular function was preserved with an estimated LVEF of 60%.   Since his catheterization he has resumed an exercise regimen.  He has no  exertional symptoms.  He specifically denies any recurrence of chest  pain, dyspnea, orthopnea, PND, edema, palpitations, lightheadedness, or  syncope.  He is doing well at present and has no complaints today.   MEDICATIONS:  1. Aspirin 325 mg daily.  2. Plavix 75 mg daily.  3. Zocor 80 mg at bedtime.  4. Omega-3 fish oil 10-12 g daily.  5. Niaspan 1500 mg daily.   ALLERGIES:  NKDA.   PHYSICAL EXAMINATION:  GENERAL:  The patient is alert and oriented.  He  is in no acute distress.  VITAL SIGNS:  Weight is 173 pounds, blood pressure 112/76 in the right  arm, 110/80 in the left arm, heart rate 66, and respiratory rate 12.  HEENT:  Normal.  NECK:  Normal carotid upstrokes.  No bruits.  JVP normal.  LUNGS:  Clear bilaterally.  HEART:  Regular rate and rhythm.  No murmurs or gallops.  ABDOMEN:  Soft and nontender.  No  organomegaly.  EXTREMITIES:  No clubbing, cyanosis, or edema.  Peripheral pulses intact  and equal throughout.  SKIN:  Warm and dry without rash.   ASSESSMENT:  1. Coronary artery disease.  Cardiac catheterization results reviewed      with the patient.  Continue medical therapy.  He should remain on      lifelong dual antiplatelet therapy with his history of stent      thrombosis.  Otherwise, continue secondary risk reduction measures      with treatment of his hypercholesterolemia.  He has not been      tolerant of the beta-blockers in the past.  2. Hyperlipidemia.  Lipids from August 2009 showed cholesterol of 105,      triglycerides 49, HDL of 28, and LDL 68.  Continue current therapy.   I would like to see Mr. Kopka back in followup in 6 months.  He was  reassured with respect to his cardiac status.      Veverly Fells. Excell Seltzer,  MD     MDC/MedQ  DD: 10/25/2008  DT: 10/26/2008  Job #: [id number]   cc:   Joycelyn Rua, M.D.

## 2011-01-13 NOTE — Letter (Signed)
October 11, 2007    Joycelyn Rua, M.D.  [facility]  [address] [place]  [place] [address]   RE:  [name], [name]  MRN:  [id number]  /  DOB:  1945-12-07   Dear Dr. Izola Price,   It was my pleasure to see [name] at the [facility] cardiology office on  October 11, 2007.  [name] presents to establish care after  hospitalization at [facility] from December 14 through December 17 of  last year.  He has a history of coronary artery disease normal and has  undergone PCI of the right coronary artery back in 2005.  He initially  had a total occlusion of the right coronary artery that was opened up by  Dr. Amil Amen and was treated with drug-eluting stents.  He presented,  again, with a downstream lesion in a branch of the right coronary artery  that was also treated with stenting.  He had really done well, and was a  few years out from his PCI procedures, so his Plavix was stopped last  summer.   He presented acutely with a feeling of tightness across the chest and  back as well as generalized weakness at his presentation in December.  He was diagnosed with an inferior ST elevation MI, and was taken  emergently to cardiac catheterization.  I performed his catheterization  and it demonstrated an occluded right coronary artery in the proximal  stented segment.  The vessel was opened up with balloon angioplasty  under IVUS guidance.  There was a good angiographic result, and  resolution of symptoms at the completion of the procedure.  His LVEF at  the time of cath was estimated at 45%.  Since he had a stent thrombosis  at such a late presentation Dr. Mayford Knife and I had both advised lifelong  aspirin and Plavix.  He was actually treated with b.i.d. Plavix for the  first month, and is maintained on aspirin and Plavix since then.   [name] feels well at present.  He has no complaints whatsoever.  He  has been exercising regularly with twice daily walks, and has no  symptoms with  that level of activity.  He walks approximately 1-1/2  miles with each walk.  He specifically denies chest pain, dyspnea,  orthopnea, PND, or edema.  He has no complaints today.     It is noteworthy that he has been intolerant of both ACE inhibitors  and beta blockers due to symptomatic hypotension.   CURRENT MEDICATIONS:  1. Zocor 80 mg daily.  2. Plavix 75 mg daily.  3. Aspirin 325 mg daily.  4. Omega-3 daily.  5. Niaspan 1500 mg daily.   PAST MEDICAL HISTORY:  1. Coronary artery disease with recent inferior wall MI.  Details as      above.  Prior PCI of a chronically occluded right coronary artery      with a drug eluting stent, and recent presentation with very late      stent thrombosis.  [name] has minimal disease in the LAD and      left circumflex.  2. Mild LV dysfunction secondary to #1.  3. Former tobacco abuse.  4. Dyslipidemia.  5. Remote injury requiring shoulder and arm surgery as a child.  6. There have been no other surgeries or hospitalizations.   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  He is  married with a grown son at  12 years old.  He is a former smoker, but quit 3 years ago.  He has no  history of drug or alcohol use.   FAMILY HISTORY:  The patient's father died at age 65 of a myocardial  infarction.  He has 11 siblings, two of them have coronary artery  disease.   REVIEW OF SYSTEMS:  A complete 12-point review of systems was performed,  and the only pertinent positives include gastroesophageal reflux and  chronic constipation.   PHYSICAL EXAMINATION:  On exam the patient is alert and oriented.  He is  in no acute distress.  He is a well-appearing age-appropriate male.  Weight 169, blood pressure 116/70, heart rate 70, respiratory rate 16.  HEENT:  Normal.  NECK:  Normal carotid upstrokes without bruits.  Jugular venous pressure  is normal.  No thyromegaly or thyroid nodules.   LUNGS:  Clear to auscultation bilaterally.  HEART:  The apex is discreet and nondisplaced.  There is no right  ventricular heave or lift.  The heart is regular rate and rhythm without  murmurs or gallops.  ABDOMEN:  Soft, nontender, no organomegaly.  No abdominal bruits.  EXTREMITIES:  No clubbing, cyanosis, or edema.  Peripheral pulses are 2+  and equal throughout.  There are no femoral bruits.  BACK:  There is no CVA tenderness.  SKIN:  There is no rash.  Skin is warm and dry.  LYMPHATICS:  No adenopathy.  NEUROLOGIC:  Cranial nerves II-XII are intact.  Strength is 5/5 and  equal in the arms and legs bilaterally.   EKG shows a sinus rhythm with possible age indeterminate inferior MI,  otherwise within normal limits   Echocardiogram reviewed from his hospitalization in December showed a  mild decrease in overall LV systolic function with the LVEF estimated at  40%-45%.  There was akinesis of the inferior wall.  Aortic valve  thickness was mildly increased.   Lipids from this hospitalization, on December 15, showed a cholesterol  of 107, HDL 25, and LDL 63.   ASSESSMENT:  1. Coronary artery disease status post inferior wall MI.      Unfortunately [name] suffered a myocardial infarction due to      very late stent thrombosis.  I would recommend lifelong aspirin and      Plavix with as little interruption as possible.  Hopefully he will      not require surgery or have any bleeding problems in the near      future, and we will be able to maintain him on dual antiplatelet      therapy indefinitely.  I spoke to [name] about enrollment in the      TRA-2P trial.  This is a large randomized controlled study      evaluating an oral thrombin receptor antagonist.  He would be      eligible for the trial because he has had a myocardial infarction      in the last 12 months.  I think that he would clearly benefit if he      happens to be randomized to treatment arm as potent to the       antiplatelet therapy as possible, in the setting of previous stent      thrombosis.  He is going to speak with our research nurses, as he      is interested in participating.  2. Dyslipidemia.  His HDL cholesterol was low during his  hospitalization, but this is a known occurrence when drawn in the      setting of an acute infarction.  He will have follow up lipids      drawn at Dr. Lenise Arena office. and I will be happy to help in his      lipid management, if desired.  He is being aggressively treated      with high-dose statin as well as Niaspan, and he is tolerating      Niaspan well.   Overall I think [name] is doing very well.  I have applauded him for  his excellent exercise program.  I will plan on seeing him back in 6  months for followup.   Dr. Titus Mould, if I can be of any assistance in [facility]. [facility] care.  Please  feel free to call at any time.    Sincerely,      Veverly Fells. Excell Seltzer, MD  Electronically Signed    MDC/MedQ  DD: 10/11/2007  DT: 10/13/2007  Job #: [id number]
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
## 2011-01-14 ENCOUNTER — Other Ambulatory Visit: Payer: Self-pay | Admitting: Cardiovascular Disease

## 2011-01-16 NOTE — Cardiovascular Report (Signed)
NAMEMarland Kitchen  [name], [name] NO.:  [id number]   MEDICAL RECORD NO.:  [id number]                   PATIENT TYPE:  OIB   LOCATION:  2899                                 FACILITY:  [facility]   PHYSICIAN:  Armanda Magic, M.D.                  DATE OF BIRTH:  1946/07/28   DATE OF PROCEDURE:  10/03/2003  DATE OF DISCHARGE:                              CARDIAC CATHETERIZATION   REFERRING PHYSICIAN:  Joycelyn Rua, M.D.   PROCEDURE:  Left heart catheterization, coronary angiography, left  ventriculography.   OPERATOR:  Armanda Magic, M.D.   INDICATIONS:  Chest pain, coronary disease.   COMPLICATIONS:  None.   IV ACCESS:  Via right femoral artery, 6-French sheath.   This is a 65 year old white male who has a history of coronary disease.  He  was cathed back in September 2003 which revealed an occluded RCA with left-  to-right collaterals, 50% proximal left main, and 50-60% diagonal disease,  as well as obtuse marginal disease.  Medical management was felt to be the  best option at that time.  He has done well on medications which have  included Imdur 30 mg a day; Coreg 3.125 mg b.i.d., but presented to me on  the 31st with very frequent exertional angina.  Apparently he has been  having some symptoms for some time now; I had not seen him in a while, but  did not feel it was any concern to come and see me, but over the past six  months he has had daily bouts of intermittent chest pain that resolved with  rest.  Again, did not see me until recently when he became concerned.  He  now presents for heart catheterization.   The patient was brought to the cardiac catheterization laboratory in the  fasting, nonsedated state.  Informed consent was obtained.  The patient was  connected to continuous heart rate and pulse oximetry monitoring and  intermittent blood pressure monitoring.  The right groin was prepped and  draped in the sterile fashion.  One percent Xylocaine was  used for local  anesthesia.  Using the modified Seldinger technique, a 6-French sheath was  placed in the right femoral artery.  Under fluoroscopic guidance, a JL-4  catheter was placed in the left coronary artery.  Multiple cine films were  taken in 30-degree RAO, 40-degree LAO views.  This catheter was then  exchanged out over a guidewire for 6-French JR-4 catheter which was placed  under fluoroscopic guidance in the right coronary artery.  Multiple cine  films were taken in the 30-degree RAO, 40-degree LAO views.  This catheter  was then exchanged out over a guidewire for a 6-French angled pigtail  catheter which was placed under fluoroscopic guidance in left ventricular  cavity.  Left ventriculography was performed in the 30-degree RAO views with  a total of 30 cc of contrast at  15 cc/sec.  The catheter was then pulled  back across the aortic valve with no significant gradient.  At the end of  the procedure, all catheters and sheaths were removed.  Manual compression  was performed until adequate hemostasis was obtained.  The patient was  transferred back to the room in stable condition.   RESULTS:  1. LEFT MAIN CORONARY ARTERY:  Widely patent and bifurcates into the left     anterior descending artery and left circumflex artery.  2. LEFT ANTERIOR DESCENDING ARTERY:  Widely patent throughout its course to     the apex.  It gives rise to a first diagonal branch which has a 60%     proximal narrowing and then bifurcates into two daughter vessels which     are widely patent.  The left circumflex gives rise to a first obtuse     marginal which has about a 40-50% narrowing in the proximal portion.  The     rest of the vessel is patent.  The left circumflex is patent throughout     its course giving rise to two more obtuse marginal branches, both of     which are very small.  There is evidence of left-to-right collaterals to     the distal RCA.  3. RIGHT CORONARY ARTERY:  Severely diseased  with a 95-99% stenosis in the     proximal to mid portion of the vessel.  There is a small trickle of TIMI-     1 flow to the distal RCA.   Left ventriculography performed in the 30-degree RAO view using a total of  30 cc of contrast at 15 cc/sec showed mild LV dysfunction, EF is 35%.  There  is severe mid to basal inferior hypokinesis to akinesis.   ASSESSMENT:  1. One vessel obstructive coronary artery disease.  RCA lesion has had some     recanalization in comparison to the cath of May 31, 2002, with TIMI-1     flow, now to the distal RCA.  There is still a high grade stenosis of 95-     99% which is long in the proximal to mid RCA.  2. Moderate LV dysfunction, EF 35% with severe inferior and mid hypo- to     akinesis.  3. Hyperlipidemia.   PLAN:  1. Continue Coreg 3.125 mg b.i.d.  Apparently the patient was supposed to     have had his Imdur increased from 30 mg to 60 mg a day but did not do     this and today his blood pressure is borderline, anywhere from 95-110,     therefore, I am going to put him back to 30 mg of Imdur a day.  We are     going to try to start Altace 2.5 mg a day for afterload reduction.  On     admission, his blood pressure is 110.  We will start him on a very low     dose instead but, clearly, he does need to have some afterload reduction     with an ACE inhibitor given his LV function.  2. We will check a fasting lipid panel.  3. Check a BMET in one week.  4. Continue aspirin.  5. Start Plavix 75 mg a day.  6. I reviewed the films with Dr. Amil Amen who has quoted at least a 70%     success rate in reopening the RCA.  Given the patient's recurrent     symptoms  of angina, which are on a daily occurrence, we are going to     bring him in next week to try to reopen the RCA, per Dr. Amil Amen.                                               Armanda Magic, M.D.    TT/MEDQ  D:  10/03/2003  T:  10/03/2003  Job:  [id number]   cc:   Joycelyn Rua, M.D. [address] [place]  [place] [address]  Fax: [phone]

## 2011-01-16 NOTE — Cardiovascular Report (Signed)
[name], [name] NO.:  [id number]   MEDICAL RECORD NO.:  [id number]          PATIENT TYPE:  OIB   LOCATION:  2899                         FACILITY:  [facility]   PHYSICIAN:  [name], M.D.     DATE OF BIRTH:  12-Oct-1945   DATE OF PROCEDURE:  07/03/2004  DATE OF DISCHARGE:                              CARDIAC CATHETERIZATION   REFERRING PHYSICIAN:  Dr. Joycelyn Alvarez.   PROCEDURE:  1.  Left heart catheterization.  2.  Coronary angiography.  3.  Left ventriculography.   OPERATOR:  [name], M.D.   INDICATIONS:  Chest pain and abnormal Cardiolite.   COMPLICATIONS:  None.   INTRAVENOUS ACCESS:  IV access via right femoral artery, 6-French sheath.   INDICATION:  This is a 65 year old white male with a history of coronary  disease.  He is status post PTCA and stenting of the RCA in February of  2005, and now presents with recurrent fatigue and chest tightness.  A stress  Cardiolite study shows a reversible defect in the inferior wall consistent  with ischemia.   DESCRIPTION OF PROCEDURE:  The patient is brought to the cardiac  catheterization laboratory in a fasting, non-sedated state.  Informed  consent was obtained.  The patient was connected to continuous heart rate  and pulse oximetry monitoring, and intermittent blood pressure monitoring.  The right groin was prepped and draped in a sterile fashion.  Xylocaine 1%  was used for local anesthesia.  Using a modified Seldinger technique, a 6-  French sheath was placed in the right femoral artery.  Under fluoroscopic  guidance, a 6-French JL4 catheter was placed in the left coronary artery.  Cine films were taken in 30-degree RAO and 40-degree LAO views.  Because of  significant catheter damping and appearance of left main spasm, this  catheter was removed over a guidewire.  A 6-French JR4 catheter was placed  under fluoroscopic guidance in the right coronary artery.  Multiple cine  angles were taken  in 30-degree RAO and 40-degree LAO views.  This catheter  was then exchanged out over a guidewire for a 5-French JL4 catheter, which  was placed under fluoroscopic guidance into the left coronary artery.  Multiple cine films were taken in 30-degree RAO and 40-degree LAO views.  There was still significant catheter damping and appeared to be narrowing of  the left main, so 200 mcg of intracoronary nitroglycerin were administered  with resolution of spasm of the left main.  Multiple cine films were taken  in the 30-degree RAO and 40-degree LAO views.  The catheter was next  exchanged out over a guidewire for 6-French angled pigtail catheter, which  was placed under fluoroscopic guidance in the left ventricular cavity.  Left  ventriculography was performed in the 30-degree RAO view with a total of 30  mL of contrast at 15 mL per second.  The catheter was then pulled back  across the aortic valve with no significant gradient noted.  At the end of  the procedure, the patient went on to percutaneous transluminal intervention  of the  posterior descending artery.   RESULTS:  1.  Left main coronary artery showed initial vasospasm, but this resolved      after intracoronary nitroglycerin 200 mcg.  The left main is widely      patent and bifurcates into the left anterior descending artery and left      circumflex artery.  The left anterior descending artery is widely patent      throughout its course to the apex, but is small in caliber.  It gives      rise to 2 diagonal branches, both of which are widely patent.  2.  The left circumflex is widely patent throughout its course and gives      rise to 2 obtuse marginal branches, both of which are widely patent.  3.  The right coronary artery is widely patent in its proximal and      midportions including the area of the stent.  Distally, it bifurcates      into the posterior descending artery and posterolateral artery.  The      posterior descending  artery has an 80% proximal narrowing.  4.  Left ventriculography shows normal left ventricular systolic function.      The aortic pressure is 99/55 mmHg, LV pressure 99/0 mmHg.   ASSESSMENT AND PLAN:  1.  One-vessel obstructive coronary disease with widely patent right      coronary artery stent, but 80% posterior descending artery.  2.  Normal left ventricular function.   PLAN:  PCI to the PDA per Dr. Francisca Alvarez.       TT/MEDQ  D:  07/03/2004  T:  07/04/2004  Job:  [id number]   cc:   [name], M.D.  [address] [place]  [place] [address]  Fax: [phone]

## 2011-01-16 NOTE — Cardiovascular Report (Signed)
[name], [name] NO.:  [id number]   MEDICAL RECORD NO.:  [id number]                   PATIENT TYPE:  OIB   LOCATION:  2864                                 FACILITY:  [facility]   PHYSICIAN:  Lesleigh Noe, M.D.            DATE OF BIRTH:  [birth year]   DATE OF PROCEDURE:  DATE OF DISCHARGE:                              CARDIAC CATHETERIZATION   INDICATIONS FOR PROCEDURE:  Abdominal Cardiolite, exertional dyspnea in this  patient who is a heavy smoker.   PROCEDURE PERFORMED:  1. Left heart catheterization.  2. Selective coronary angiogram.  3. Left ventriculography.   DESCRIPTION OF PROCEDURE:  After informed consent, a 6 French sheath was  inserted into the right femoral artery using a modified Seldinger technique.  A 6 French A2 multipurpose catheter was used for hemodynamic recordings,  left ventriculography, and selective left and right coronary angiography.  We also used a #4 6 French left Judkins catheter for left coronary  angiography and also the selective injection of 100 mcg of intracoronary  nitroglycerin. The patient had catheter damping with the Judkins catheter  even after nitroglycerin administration.  The patient tolerated the  procedure without complications.   RESULTS:  I:  HEMODYNAMIC DATA:  a.  Aortic pressure 114/67.  b.  Left ventricular pressure 114/14 mmHg.   II:  LEFT VENTRICULOGRAPHY:  The inferior wall and apex were moderately to  severely hypokinetic. EF was estimated to be 40%.   III:  SELECTIVE CORONARY ANGIOGRAPHY:  a.  Left main coronary artery:  The left main coronary artery causes  catheter damping with engagement especially with a Judkins catheter.  Initially, there appeared to be 50% narrowing. After intracoronary  nitroglycerin, catheter damping continued to occur, but the percent stenosis  appeared to be less severe, perhaps in the 40% range.  b.  Left anterior descending coronary:  The LAD reaches  the left ventricular  apex but does not go on to the inferior wall.  There are proximal luminal  irregularities.  The first diagonal contains segmental 70% narrowing. This  diagonal was large.  c.  Circumflex artery:  Three obtuse marginals arise from the circumflex.  The first obtuse marginal was large and contains a 60-70% narrowing.  The  second and third obtuse marginals are relatively small.  d.  Right coronary:  Totally occluded at the ostium.  This appears to be a  chronic total occlusion with right to right homo collaterals.  The distal  vessel was large and gives the left ventricular branch as well as the large  PDA.   CONCLUSION:  1. Severe coronary artery disease with chronic total occlusion of the right     coronary and borderline left main.     There is significant first diagonal and borderline first obtuse marginal.  2. Left ventricular dysfunction.   RECOMMENDATIONS:  Consider coronary artery bypass grafting.  Lesleigh Noe, M.D.    HWS/MEDQ  D:  05/31/2002  T:  06/04/2002  Job:  [id number]   cc:   Joycelyn Rua, M.D.  [address] [place]  [place] [address]  Fax: [phone]   Armanda Magic, M.D.  [address]  [place], [place] [address]  Fax: [phone]

## 2011-01-16 NOTE — Cardiovascular Report (Signed)
[name], [name] NO.:  [id number]   MEDICAL RECORD NO.:  [id number]          PATIENT TYPE:  OIB   LOCATION:  2899                         FACILITY:  [facility]   PHYSICIAN:  Francisca December, M.D.  DATE OF BIRTH:  1946-07-16   DATE OF PROCEDURE:  07/03/2004  DATE OF DISCHARGE:                              CARDIAC CATHETERIZATION   PROCEDURES PERFORMED:  Percutaneous coronary intervention/drug-eluting stent  implantation, proximal posterior descending artery.   INDICATION:  Mr. [name] is a 65 year old man with known ASCVD, now  approximately 6 months status post PCI/drug-eluting stent implantation in  proximal mid right coronary artery.  He did well for several months and then  began developing fatigue, and mild dyspnea.  An exercise Cardiolite was  significant for reversible inferior defect.  Dr. Armanda Magic has completed  coronary angiography revealing a de novo lesion in the posterior descending  artery ostium.  He is to undergo catheter-based revascularization at this  time.   PROCEDURAL NOTE:  Utilizing the previously placed 6-French catheter sheath  by Dr. Mayford Knife, a 6-French #4 FR guiding catheter was advanced to the  ascending aorta, where the right coronary os was engaged.  The patient  received a standard bolus of Angiomax, which was 0.75 mg/kg, over 2 minutes  and then a constant infusion of 1.75 mg/kg per hour.  The resultant ACT was  300 seconds.  A 0.014-inch SciMed luge intracoronary guidewire was passed  across the lesion in the posterior descending artery without extensive  difficulty.  Initial balloon dilatation was performed with a 2.5/20.0-mm  SciMed Maverick.  The device was inflated to 8 atmospheres for approximately  2 minutes.  This device was deflated and removed.  A 2.5/25.0-mm SciMed  TAXUS intracoronary drug-eluting stent was then advanced into place.  This  was carefully positioned such that the proximal end of the stent was at  the  origin of the PDA, but not protruding into the distal RCA.  The stent was  deployed at a peak pressure of 12 atmospheres for 50 seconds.  The balloon  was deflated and removed.  Adequate patency was confirmed on orthogonal  views, both with and without the guidewire in place.  The guiding catheter  was then removed.  A 45-degree RAO cineangiogram of the right femoral artery  was performed via the catheter sheath by hand injection.  It revealed the  arteriotomy site to be well-above the bifurcation into the profunda femoris  and superficial femoral arteries.  There was no significant atherosclerotic  disease noted.  I deployed the AngioSeal percutaneous closure device without  difficulty and with good hemostasis.   ANGIOGRAPHY:  As mentioned, the lesion treated was in the proximal posterior  descending artery.  It was diffuse, approximately 15-18 mm in length.  It  was tightest near the origin, about 90% stenotic.  Following balloon  dilatation and stent implantation, there was no residual stenosis.   FINAL IMPRESSION:  1.  Atherosclerotic coronary vascular disease, single vessel.  2.  Status post successful percutaneous coronary intervention/drug-eluting      stent  implantation, proximal posterior descending artery.  3.  Typical angina was not reproduced with device insertion and balloon      inflation.       JHE/MEDQ  D:  07/03/2004  T:  07/04/2004  Job:  [id number]

## 2011-01-16 NOTE — Op Note (Signed)
   NAME:  AVIYON, HOCEVAR NO.:  [id number]   MEDICAL RECORD NO.:  [id number]                   PATIENT TYPE:  AMB   LOCATION:  ENDO                                 FACILITY:  [facility]   PHYSICIAN:  Graylin Shiver, M.D.                DATE OF BIRTH:  02-Apr-1946   DATE OF PROCEDURE:  03/14/2003  DATE OF DISCHARGE:                                 OPERATIVE REPORT   PROCEDURE:  Colonoscopy with biopsy.   INDICATIONS FOR PROCEDURE:  Screening, chronic constipation.   CONSENT:  Informed consent was obtained after explanation of the risks of  bleeding, infection, perforation   PREMEDICATION:  Fentanyl 87.5 mcg IV, Versed 7 mg IV.   DESCRIPTION OF PROCEDURE:  With the patient in the left lateral decubitus  position, the rectal exam was performed and no masses were felt.  The  Olympus pediatric colonoscope was inserted into the rectum and advanced  around the colon to the cecum.  Cecal landmarks were identified.  The cecum  and ascending colon were normal.  The transverse colon was normal.  The  descending colon was normal.  The sigmoid revealed an occasional small  diverticulum.  The rectum revealed a small 3 mm polyp which was removed with  cold forceps.  He tolerated the procedure well without complications.   IMPRESSION:  1. Small rectal polyp.  2. Mild diverticulosis of the sigmoid.   PLAN:  The pathology will be checked.                                               Graylin Shiver, M.D.    SFG/MEDQ  D:  03/15/2003  T:  03/15/2003  Job:  [id number]   cc:   Joycelyn Rua, M.D.  [address] [place]  [place] [address]  Fax: [phone]

## 2011-01-16 NOTE — Cardiovascular Report (Signed)
[name], [name] NO.:  [id number]   MEDICAL RECORD NO.:  [id number]          PATIENT TYPE:  OIB   LOCATION:  2899                         FACILITY:  [facility]   PHYSICIAN:  Armanda Magic, M.D.     DATE OF BIRTH:  [birth year]   DATE OF PROCEDURE:  02/22/2006  DATE OF DISCHARGE:  02/22/2006                              CARDIAC CATHETERIZATION   REFERRED BY:  Joycelyn Rua, M.D.   PROCEDURE:  Left heart catheterization, coronary angiography, left  ventriculography.   SURGEON:  Armanda Magic, M.D.   INDICATIONS FOR PROCEDURE:  Fatigue and chest pain.   COMPLICATIONS:  None.   IV ACCESS:  Via right femoral artery 6-French sheath.   This is a very pleasant 65 year old male with a history of coronary disease  status post PTCA and stenting of the PDA in November of 2005. Initially he  had a PTCA and stenting of the RCA in February 2005 and then had a Taxus  stent placed in the ostium of the PDA in November 2005. He now present with  recurrence of his fatigue which is his anginal equivalent and chest pain.   The patient was brought to the cardiac catheterization laboratory in the  fasting nonsedated state. Informed consent was obtained. The patient was  connected to the machine with heart rate and pulse oximetry monitoring and  intermittent blood pressure monitoring. The right groin was prepped and  draped in a sterile fashion. 1% Xylocaine was used for local anesthesia.  Using the modified Seldinger technique, a 6 French sheath was placed in the  right femoral artery. Under fluoroscopic guidance, a 6-French JR4 catheter  was placed in the left coronary artery. Multiple cine films were taken in 30  degree RAO and 40 degree LAO views. This catheter was then exchanged out  over a guidewire for a 6-French JR4 catheter which was placed under  fluoroscopic guidance in the right coronary artery. Multiple cine films were  taken in the 30 degree RAO and 40 degree LAO  views. The catheter was then  exchanged out over a guidewire for a 6 French angled pigtail catheter which  was placed under fluoroscopic guidance in the left ventricular catheter.  Left ventriculography was performed in the 30 degree RAO views in a total of  30 mL of contrast at 15 mL/second. The catheter was then pulled back across  the aortic valve with no significant gradient noted. At the end of the  procedure, all catheters and sheaths were removed. __________ was performed  until adequate hemostasis was obtained. The patient was transferred back to  the room in stable condition.   RESULTS:  The left main coronary artery is widely patent and trifurcates in  the left anterior descending artery ramus branch and left circumflex artery.  The left anterior descending artery is widely patent throughout its course  to the apex giving rise to diagonal branches. The first diagonal has a 20%  ostial narrowing otherwise it is widely patent. The second diagonal is  widely patent. The ramus branch is widely patent.   The left  circumflex traverses the AV groove and is widely patent giving rise  to two obtuse marginal branches both of which are widely patent.   The right coronary artery is widely patent throughout its course and  bifurcates distally in the posterior descending artery and posterolateral  artery. There was a stent widely patent in the proximal portion of the  vessel. There is also a stent in the proximal portion of the posterior  descending artery with a 60% InStent restenosis.   Left ventriculography shows normal LV systolic function and LV pressure of  125/1 mmHg, aortic pressure 122/66 mmHg.   ASSESSMENT:  1. Nonobstructed coronary disease with borderline PDA lesion.  2. Normal left ventricular function.   PLAN:  1. Discharge to home.  2. Followup with me in 2 weeks.  3. Will obtain a stress Cardiolite in our office in 1 week to assess the      significance of the PDA  lesion.      Armanda Magic, M.D.  Electronically Signed     TT/MEDQ  D:  04/21/2006  T:  04/21/2006  Job:  [id number]   cc:   Joycelyn Rua, M.D.

## 2011-01-16 NOTE — Cardiovascular Report (Signed)
NAME:  [name], SUIT NO.:  [id number]   MEDICAL RECORD NO.:  [id number]                   PATIENT TYPE:  OIB   LOCATION:  6527                                 FACILITY:  [facility]   PHYSICIAN:  Francisca December, M.D.               DATE OF BIRTH:  [birth year]   DATE OF PROCEDURE:  10/12/2003  DATE OF DISCHARGE:  10/13/2003                              CARDIAC CATHETERIZATION   PROCEDURES PERFORMED:  Percutaneous coronary intervention/multiple drug-  eluting stent implantation right coronary artery.   INDICATION:  Mr. Silvia Markuson is a 65 year old man with known ASCVD.  He has a  chronically occluded right coronary artery with some recannulization.  He  has recently had progressive angina.  Dr. Mayford Knife has completed coronary  angiography confirming antegrade flow in the right coronary with what  appears to be recannulization.  The patient is to undergo attempted catheter-  based revascularization at this time.   PROCEDURAL NOTE:  The patient was brought to the cardiac catheterization  laboratory in the fasting state.  The right groin was prepped and draped in  the  usual sterile fashion.  Local anesthesia was obtained with the  infiltration of 1% lidocaine.  A 6-French catheter sheath was inserted  percutaneously into the right femoral artery utilizing an anterior approach  over a guiding J wire.  A 6-French AL1 guiding catheter was advanced to the  ascending aorta where left coronary ostia was engaged.  The patient received  Angiomax at a 1.75 mg/kg/hr constant infusion following a 0.75 mg/kg bolus.  He had been pretreated with Plavix.  The lesion was then crossed.  Initial  attempts at Whisper 300 and a grand slam 180 were unsuccessful.  The lesion  was eventually crossed with a Shinovi 300 cm device.  Initial balloon  dilatation was performed with a 1.5/20 mm over-the-wire Maverick inflated to  9 atmospheres for 63 seconds.  This was removed and replaced  with a 1.5/15  mm over-the-wire Voyager.  This was inflated in two positions to 14  atmospheres for not greater than 43 seconds.  This device was replaced with  a 2.5/40 mm long Viva.  This was inflated to 6 atmospheres for about 200  seconds.  Finally, the artery was stented using a 2.75/32 mm Taxus.  This  was inflated to 14 atmospheres for 58 seconds.  There remained some residual  stenosis in the mid to distal segment that was not covered by the stent.  In  fact, I was unable to advance it into this portion.  I subsequently treated  this area with a 2.0/30 mm over-the-wire Maverick and then a 2.5/20 mm  Maverick.  The Maverick balloon was also used to inflate in the more distal  portion of the right coronary on one occasion to 6 atmospheres for 72  seconds.  There was a nonocclusive intimal dissection in the mid to  distal  portion of the right coronary just after the distal end of the stented  segment.  I was unable to advance a second stent into this region.  Therefore, treated it with long balloon dilatation only.  This was with the  2.5/20 mm Maverick for about 300 seconds or 5 minutes.  Finally, a 2.75/16  mm Taxus intracoronary drug-eluting stent was placed in the more proximal  portion of the vessel and inflated there to 16 atmospheres for 65 seconds.  This resulted in wide patency in the mid and proximal portion of the right  coronary.  There was a residual 30% stenosis just after the distal end of  the stent.  This was the site of he nonocclusive intimal dissection.  The  distal vessel was diseased, but patent.  The posterior descending artery,  however, had a long 80% narrowing proximally.  There was good TIMI-3 flow at  completion.   ANGIOGRAPHY:  As mentioned, the lesion treated was in the proximal mid  portion of the right coronary.  It was chronically occluded with  recannulization 99% obstructed.  Following balloon dilatation and stent  implementation, there was no  residual stenosis in the proximal mid portion.  There was a 30-40% residual stenosis in the mid to distal segment with  nonocclusive intimal dissection as noted above.  Distal disease was  mentioned.  There was a large posterior lateral segment and a single large  posterior lateral branch that was widely patent at completion.   FINAL IMPRESSION:  1. Atherosclerotic coronary vascular disease, single vessel.  2. Status post successful percutaneous transluminal coronary angioplasty and     drug-eluting stent implantation in proximal mid portion of the right     coronary.  3. Residual distal stenosis with 30-40% obstruction and nonocclusive intimal     dissection.   The patient did have a right femoral arteriogram at the completion in a 45-  degree RAO angle for possible Angio-Seal.  This did show the artery to be  widely patent and the arteriotomy site to be at the bifurcation and the  profunda femoris and superficial femoral arteries.  I then proceeded with  Angio-Seal closure successfully.                                               Francisca December, M.D.    JHE/MEDQ  D:  11/12/2003  T:  11/13/2003  Job:  [id number]   cc:   Armanda Magic, M.D.  [address], [address]  [place], [place] [address]  Fax: [phone]

## 2011-01-16 NOTE — Discharge Summary (Signed)
NAMEMEHUL, RUDIN NO.:  [id number]   MEDICAL RECORD NO.:  [id number]          PATIENT TYPE:  INP   LOCATION:  2003                         FACILITY:  [facility]   PHYSICIAN:  Armanda Magic, M.D.     DATE OF BIRTH:  12-10-1945   DATE OF ADMISSION:  08/14/2007  DATE OF DISCHARGE:  08/17/2007                               DISCHARGE SUMMARY   DISCHARGE DIAGNOSES:  1. Acute ST-segment elevated myocardial infarction, status post      percutaneous intervention to the right coronary artery.  2. Hypokalemia, resolved.  3. Mild left ventricular dysfunction, EF 45%.  4. Transient hypotension, resolved.  5. Former smoker.   HOSPITAL COURSE:  [name] is a 65 year old male patient with a known  history of coronary artery disease, who received two stents to the right  coronary artery greater than a year ago.  One and a half hours prior to  his arrival he began feeling bilateral arm fatigue associated with  nausea, vomiting, diaphoresis and shortness of breath.  EMS was called.  An EKG showed an inferior ST-segment elevated myocardial infarction.   The patient was taken emergently to the cardiac catheterization lab and  was found to have a total of right coronary artery proximally.  PCI  (angioplasty) was performed under IVUS guidance, resulting in a mild  residual mid RCA stenosis with TIMI-3 flow.   The patient remained in the hospital over the next several days and  gradually increased his activity under the direction of cardiac  rehabilitation.  He was found to be hypokalemic at one point and this  was repleted.  He did have mild LV dysfunction with an EF of  approximately 45% but was unable to be placed on medications such as  beta blocker secondary to low blood pressure.   Lab studies during his stay include a white count of 9.4, hemoglobin  12.0, hematocrit 34.6, platelets 192.  Sodium 141, potassium 4.7, BUN 7,  creatinine 0.8.  CK 1027, MB 173.6, troponin 37.06.   Total cholesterol  145, triglycerides 254, LDL 67, HDL 31.  TSH 2.910.   DISCHARGE MEDICATIONS:  1. Enteric aspirin 325 mg one p.o. daily.  2. Plavix 75 mg one p.o. b.i.d. for 1 month, then 1 tablet daily.  3. Niaspan 1000 mg 1-1/2 tablets daily.  4. Zocor 80 mg q.h.s.   The patient is to increase activity slowly, follow up with Dr. Mayford Knife on  August 30, 2007, at 10:45 a.m.      Guy Franco, P.A.      Armanda Magic, M.D.  Electronically Signed    LB/MEDQ  D:  09/12/2007  T:  09/13/2007  Job:  [id number]

## 2011-06-05 LAB — BASIC METABOLIC PANEL
BUN: 12
Calcium: 9.2
Chloride: 103
Creatinine, Ser: 0.97
GFR calc Af Amer: >60
GFR calc non Af Amer: >60
Glucose, Bld: 110 — ABNORMAL HIGH
Glucose, Bld: 127 — ABNORMAL HIGH
Potassium: 3.4 — ABNORMAL LOW
Potassium: 4.7
Sodium: 141

## 2011-06-05 LAB — HEPATIC FUNCTION PANEL
ALT: 41
Bilirubin, Direct: <0.1
Total Protein: 6.1

## 2011-06-05 LAB — TSH: TSH: 1.819

## 2011-06-05 LAB — CBC
HCT: 34.6 — ABNORMAL LOW
MCHC: 34.7
MCV: 95.2
Platelets: 192
RDW: 12.7
WBC: 9.4

## 2011-06-05 LAB — LIPID PANEL
HDL: 25 — ABNORMAL LOW
Total CHOL/HDL Ratio: 4.3
VLDL: 19

## 2011-06-05 LAB — HEPARIN LEVEL (UNFRACTIONATED): Heparin Unfractionated: <0.1 — ABNORMAL LOW

## 2011-06-05 LAB — CARDIAC PANEL(CRET KIN+CKTOT+MB+TROPI)
CK, MB: 47.6 — ABNORMAL HIGH
Relative Index: 10 — ABNORMAL HIGH
Troponin I: 10.38

## 2011-06-05 LAB — CK TOTAL AND CKMB (NOT AT ARMC)
CK, MB: 173.6 — ABNORMAL HIGH
Total CK: 1027 — ABNORMAL HIGH

## 2011-06-08 LAB — DIFFERENTIAL
Basophils Absolute: 0.1
Basophils Relative: 1
Eosinophils Relative: 1
Lymphocytes Relative: 15
Monocytes Absolute: 0.4
Neutro Abs: 11.1 — ABNORMAL HIGH

## 2011-06-08 LAB — PROTIME-INR: Prothrombin Time: 12

## 2011-06-08 LAB — MAGNESIUM: Magnesium: 2.1

## 2011-06-08 LAB — APTT: aPTT: 25

## 2011-06-08 LAB — I-STAT 8, (EC8 V) (CONVERTED LAB)
Bicarbonate: 24.9 — ABNORMAL HIGH
Glucose, Bld: 185 — ABNORMAL HIGH
TCO2: 26
pCO2, Ven: 41 — ABNORMAL LOW
pH, Ven: 7.392 — ABNORMAL HIGH

## 2011-06-08 LAB — CBC
HCT: 44.4
Hemoglobin: 15
MCHC: 33.8
Platelets: 258
RDW: 12.9

## 2011-06-08 LAB — POCT CARDIAC MARKERS
CKMB, poc: 4.6
Myoglobin, poc: >500

## 2011-06-08 LAB — TSH: TSH: 2.91

## 2011-06-08 LAB — LIPID PANEL: Cholesterol: 145

## 2011-06-15 ENCOUNTER — Encounter: Payer: Self-pay | Admitting: Cardiovascular Disease

## 2011-06-16 ENCOUNTER — Ambulatory Visit (INDEPENDENT_AMBULATORY_CARE_PROVIDER_SITE_OTHER): Payer: BC Managed Care – PPO | Admitting: Cardiovascular Disease

## 2011-06-16 ENCOUNTER — Encounter: Payer: Self-pay | Admitting: Cardiovascular Disease

## 2011-06-16 DIAGNOSIS — I2510 Atherosclerotic heart disease of native coronary artery without angina pectoris: Secondary | ICD-10-CM

## 2011-06-16 DIAGNOSIS — R072 Precordial pain: Secondary | ICD-10-CM

## 2011-06-16 DIAGNOSIS — E7800 Pure hypercholesterolemia, unspecified: Secondary | ICD-10-CM

## 2011-06-16 LAB — T4, FREE: Free T4: 0.66 ng/dL (ref 0.60–1.60)

## 2011-06-16 NOTE — Patient Instructions (Signed)
Your physician wants you to follow-up in: 1 YEAR. You will receive a reminder letter in the mail two months in advance. If you don't receive a letter, please call our office to schedule the follow-up appointment.  Your physician has requested that you have en exercise stress myoview. For further information please visit [email or web address] Please follow instruction sheet, as given.  Your physician recommends that you have lab work today: TSH, Free T4  Your physician recommends that you continue on your current medications as directed. Please refer to the Current Medication list given to you today.

## 2011-06-16 NOTE — Assessment & Plan Note (Signed)
The patient has recurrent symptoms which are concerning. I have recommended that he have an exercise stress Myoview scan to rule out recurrent ischemia. I've also recommended thyroid studies in the setting of his progressive fatigue and chills. Will determine his need for repeat cardiac catheter after his Myoview scan is completed for further risk stratification. He will continue his current medications.

## 2011-06-16 NOTE — Progress Notes (Signed)
HPI:  This is a 65 year old gentleman with coronary artery disease presenting for followup evaluation. He has a history of stent thrombosis and underwent PTCA of the right coronary artery in 2009. The patient developed recurrent chest pain in 2010 and underwent diagnostic catheterization showing patency of his stent site but he did have mild to moderate stenosis in the PDA branch of the right coronary artery. His left coronary artery had mild nonobstructive plaque.  The patient has developed recurrent chest tightness. This does not consistently occur with exertion and in fact it occurs more often at rest. His symptoms have worsened over the past few weeks as he has developed progressive dyspnea and fatigue. He's had a few episodes of chills. His energy level has been slowly getting worse now for several months.  He has previously been walking at least 4 miles per day but has slowed down over the past few weeks because he hasn't felt well. He denies lightheadedness or syncope. He has had palpitations.  Outpatient Encounter Prescriptions as of 06/16/2011  Medication Sig Dispense Refill  . aspirin 325 MG tablet Take 325 mg by mouth daily.        . niacin (NIASPAN) 750 MG CR tablet Take 750 mg by mouth. Take 2 tablets a bed time       . Omega-3 Fatty Acids (OMEGA-3 FISH OIL PO) Take by mouth. 10-12 grams daily       . PLAVIX 75 MG tablet TAKE 1 TABLET ONCE DAILY  90 tablet  3  . simvastatin (ZOCOR) 40 MG tablet        . DISCONTD: NON FORMULARY TRAP2P study drug       . DISCONTD: simvastatin (ZOCOR) 40 MG tablet TAKE 1 TABLET ONCE DAILY  90 tablet  3  . DISCONTD: simvastatin (ZOCOR) 40 MG tablet Take 1 tablet (40 mg total) by mouth at bedtime.  30 tablet  11    No Known Allergies  Past Medical History  Diagnosis Date  . Myocardial infarct 09-11-06  . Coronary artery disease   . Hyperlipidemia     ROS: Negative except as per HPI  BP 104/78  Pulse 74  Resp 18  Ht 6' (1.829 m)  Wt 165 lb 12.8  oz (75.206 kg)  BMI 22.49 kg/m2  PHYSICAL EXAM: Pt is alert and oriented, NAD HEENT: normal Neck: JVP - normal, carotids 2+= without bruits Lungs: CTA bilaterally CV: RRR without murmur or gallop Abd: soft, NT, Positive BS, no hepatomegaly Ext: no C/C/E, distal pulses intact and equal Skin: warm/dry no rash  EKG:  Normal sinus rhythm 74 beats per minute, within normal limits.  ASSESSMENT AND PLAN:

## 2011-06-16 NOTE — Assessment & Plan Note (Signed)
Lipids are followed by Dr. Lenise Arena. I have a report from July 24 showing a cholesterol of 126, triglycerides 103, LDL 73, and HDL 35. This is improved from his previous readings.

## 2011-06-22 ENCOUNTER — Encounter: Payer: Self-pay | Admitting: *Deleted

## 2011-06-29 ENCOUNTER — Ambulatory Visit (HOSPITAL_COMMUNITY): Payer: BC Managed Care – PPO | Attending: Cardiovascular Disease | Admitting: Radiology

## 2011-06-29 DIAGNOSIS — I2510 Atherosclerotic heart disease of native coronary artery without angina pectoris: Secondary | ICD-10-CM

## 2011-06-29 DIAGNOSIS — R0789 Other chest pain: Secondary | ICD-10-CM

## 2011-06-29 DIAGNOSIS — R072 Precordial pain: Secondary | ICD-10-CM

## 2011-06-29 DIAGNOSIS — R0989 Other specified symptoms and signs involving the circulatory and respiratory systems: Secondary | ICD-10-CM

## 2011-06-29 MED ORDER — TECHNETIUM TC 99M TETROFOSMIN IV KIT
11.0000 | PACK | Freq: Once | INTRAVENOUS | Status: AC | PRN
  Administered 2011-06-29: 11 via INTRAVENOUS

## 2011-06-29 MED ORDER — TECHNETIUM TC 99M TETROFOSMIN IV KIT
33.0000 | PACK | Freq: Once | INTRAVENOUS | Status: AC | PRN
  Administered 2011-06-29: 33 via INTRAVENOUS

## 2011-06-29 NOTE — Progress Notes (Signed)
[name] SITE [address] NUCLEAR MED [address] [place] [address] [phone]  Cardiology Nuclear Med Study  [name] is a 65 y.o. male [id number] July 01, 1946   Nuclear Med Background Indication for Stress Test:  Evaluation for Ischemia and  PTCA/Stent Patency  History:  5+yrs ago MPS @ [facility]:No report;'05 Stent-PDA/RCA; '08 IWMI.PTCA with in-stent thrombosis; '10 Cath:N/O CAD, EF=56%; '11 Stress Echo:Normal, EF=65% Cardiac Risk Factors: Family History - CAD, History of Smoking and Lipids  Symptoms:  Chest Tightness with and without Exertion, off and on (last episode of chest discomfort is now, 2-3/10.), DOE and Fatigue   Nuclear Pre-Procedure Caffeine/Decaff Intake:  None NPO After: 7:30pm   Lungs:  Clear. IV 0.9% NS with Angio Cath:  20g  IV Site: R Antecubital  IV Started by:  [name], EMT-P  Chest Size (in):  41 Cup Size: n/a  Height: 6' (1.829 m)  Weight:  165 lb (74.844 kg)  BMI:  Body mass index is 22.38 kg/(m^2). Tech Comments:  NA    Nuclear Med Study 1 or 2 day study: 1 day  Stress Test Type:  Stress  Reading MD: Jack Ancona, MD  Order Authorizing Provider:  Tonny Bollman, MD  Resting Radionuclide: Technetium 18m Tetrofosmin  Resting Radionuclide Dose: 11.0 mCi   Stress Radionuclide:  Technetium 53m Sestamibi  Stress Radionuclide Dose: 33.0 mCi           Stress Protocol Rest HR: 67 Stress HR: 160  Rest BP: Sitting 137/77  Standing  136/80 Stress BP: 178/68  Exercise Time (min): 7:01 METS: 8.5   Predicted Max HR: 156 bpm % Max HR: 102.56 bpm Rate Pressure Product: 57846   Dose of Adenosine (mg):  n/a Dose of Lexiscan: n/a mg  Dose of Atropine (mg): n/a Dose of Dobutamine: n/a mcg/kg/min (at max HR)  Stress Test Technologist: [name], CMA-N  Nuclear Technologist:  [name], CNMT     Rest Procedure:  Myocardial perfusion imaging was performed at rest 45 minutes following the intravenous administration of Technetium  63m Tetrofosmin.  Rest ECG: Nonspecific ST-T wave changes with prior IWMI  Stress Procedure:  The patient exercised for 7:01 on the treadmill utilizing the Bruce protocol.  The patient stopped due to fatigue and denied any chest pain.  There significant ST-T wave changes.  Technetium 61m Tetrofosmin was injected at peak exercise and myocardial perfusion imaging was performed after a brief delay.  [name] reviewed stress EKG's and images and said it was OK for patient  to leave.    Stress ECG: < 1 mm ST depression inferiorly and in V4/V5.   QPS Raw Data Images:  Normal; no motion artifact; normal heart/lung ratio. Stress Images:  Mild inferior perfusion defect. Rest Images:  Mild inferior perfusion defect.  Subtraction (SDS):  Mild fixed inferior perfusion defect.  Transient Ischemic Dilatation (Normal <1.22):  1.03 Lung/Heart Ratio (Normal <0.45):  0.31  Quantitative Gated Spect Images QGS EDV:  100 ml QGS ESV:  42 ml QGS cine images:  NL LV Function; NL Wall Motion QGS EF: 58%  Impression Exercise Capacity:  Fair exercise capacity. BP Response:  Normal blood pressure response. Clinical Symptoms:  Chest pain was present.  ECG Impression:  < 1 mm ST depression inferiorly and in V4/V5.  Comparison with Prior Nuclear Study: No images to compare  Overall Impression:  Normal stress nuclear study.  Mild fixed inferior perfusion defect was likely diaphragmatic attenuation given normal wall motion.  Patient had  chest pain and nonspecific ST segment changes.   [name]

## 2011-07-03 ENCOUNTER — Telehealth: Payer: Self-pay | Admitting: Cardiovascular Disease

## 2011-07-03 NOTE — Telephone Encounter (Signed)
New message Pt called about test results

## 2011-07-07 NOTE — Telephone Encounter (Signed)
I spoke with the pt and made him aware of myoview results.  

## 2011-07-07 NOTE — Telephone Encounter (Signed)
PT AWARE WILL FORWARD TO DR Excell Seltzer  TO REVIEW AND WILL CALL  BACK ONCE TEST RESVIEWED./CY

## 2011-07-28 ENCOUNTER — Encounter: Payer: Self-pay | Admitting: Cardiovascular Disease

## 2012-01-09 ENCOUNTER — Other Ambulatory Visit: Payer: Self-pay | Admitting: Cardiovascular Disease

## 2012-01-27 ENCOUNTER — Other Ambulatory Visit: Payer: Self-pay | Admitting: Cardiovascular Disease

## 2012-03-11 ENCOUNTER — Other Ambulatory Visit: Payer: Self-pay | Admitting: Cardiovascular Disease

## 2012-07-01 ENCOUNTER — Ambulatory Visit (INDEPENDENT_AMBULATORY_CARE_PROVIDER_SITE_OTHER): Payer: Medicare Other | Admitting: Cardiovascular Disease

## 2012-07-01 ENCOUNTER — Encounter: Payer: Self-pay | Admitting: Cardiovascular Disease

## 2012-07-01 VITALS — BP 108/62 | HR 64 | Ht 72.0 in | Wt 166.0 lb

## 2012-07-01 DIAGNOSIS — E7800 Pure hypercholesterolemia, unspecified: Secondary | ICD-10-CM

## 2012-07-01 DIAGNOSIS — I2510 Atherosclerotic heart disease of native coronary artery without angina pectoris: Secondary | ICD-10-CM

## 2012-07-01 LAB — LIPID PANEL
Cholesterol: 132 mg/dL (ref 0–200)
HDL: 36.1 mg/dL — ABNORMAL LOW (ref >39.00)
VLDL: 15 mg/dL (ref 0.0–40.0)

## 2012-07-01 LAB — HEPATIC FUNCTION PANEL
ALT: 30 U/L (ref 0–53)
Bilirubin, Direct: 0.1 mg/dL (ref 0.0–0.3)
Total Protein: 7.4 g/dL (ref 6.0–8.3)

## 2012-07-01 NOTE — Patient Instructions (Signed)
Your physician recommends that you have lab work today: LIPID and LIVER  Your physician wants you to follow-up in: 1 YEAR.  You will receive a reminder letter in the mail two months in advance. If you don't receive a letter, please call our office to schedule the follow-up appointment.  Your physician recommends that you continue on your current medications as directed. Please refer to the Current Medication list given to you today.  

## 2012-07-01 NOTE — Progress Notes (Signed)
HPI:  66 year old gentleman presenting for followup evaluation. The patient has a history of coronary artery disease and inferior MI in 2009 secondary to stent thrombosis. His last stress test one year ago showed no ischemia. [name] is doing very well. He walks 3 miles several days per week without exertional symptoms. He denies chest pain, chest pressure, dyspnea, or edema. He's had no palpitations or lightheadedness. He's been compliant with his medicines. He notes it's been some time since she's had lipids checked.  Outpatient Encounter Prescriptions as of 07/01/2012  Medication Sig Dispense Refill  . aspirin 325 MG tablet Take 325 mg by mouth daily.        . clopidogrel (PLAVIX) 75 MG tablet TAKE 1 TABLET ONCE DAILY  90 tablet  2  . NIASPAN 750 MG CR tablet TAKE 2 TABLETS AT BEDTIME  180 tablet  3  . Omega-3 Fatty Acids (OMEGA-3 FISH OIL PO) Take by mouth. 10-12 grams daily       . simvastatin (ZOCOR) 40 MG tablet       . DISCONTD: simvastatin (ZOCOR) 40 MG tablet TAKE 1 TABLET ONCE DAILY  90 tablet  2    No Known Allergies  Past Medical History  Diagnosis Date  . Myocardial infarct 09-11-06  . Coronary artery disease   . Hyperlipidemia     ROS: Negative except as per HPI  BP 108/62  Pulse 64  Ht 6' (1.829 m)  Wt 75.297 kg (166 lb)  BMI 22.51 kg/m2  PHYSICAL EXAM: Pt is alert and oriented, NAD HEENT: normal Neck: JVP - normal, carotids 2+= without bruits Lungs: CTA bilaterally CV: RRR without murmur or gallop Abd: soft, NT, Positive BS, no hepatomegaly Ext: no C/C/E, distal pulses intact and equal Skin: warm/dry no rash  EKG:  Normal sinus rhythm 64 beats per minute, cannot rule out age indeterminate inferior MI, otherwise within normal limits.  Nuclear Stress Test 06/29/2011: QPS  Raw Data Images: Normal; no motion artifact; normal heart/lung ratio.  Stress Images: Mild inferior perfusion defect.  Rest Images: Mild inferior perfusion defect.  Subtraction (SDS): Mild  fixed inferior perfusion defect.  Transient Ischemic Dilatation (Normal <1.22): 1.03  Lung/Heart Ratio (Normal <0.45): 0.31  Quantitative Gated Spect Images  QGS EDV: 100 ml  QGS ESV: 42 ml  QGS cine images: NL LV Function; NL Wall Motion  QGS EF: 58%  Impression  Exercise Capacity: Fair exercise capacity.  BP Response: Normal blood pressure response.  Clinical Symptoms: Chest pain was present.  ECG Impression: < 1 mm ST depression inferiorly and in V4/V5.  Comparison with Prior Nuclear Study: No images to compare  Overall Impression: Normal stress nuclear study. Mild fixed inferior perfusion defect was likely diaphragmatic attenuation given normal wall motion. Patient had chest pain and nonspecific ST segment changes.  [name] Shirlee Latch   ASSESSMENT AND PLAN: 1. CAD, native vessel. The patient is stable without anginal symptoms. I would recommend that he stay on long-term dual antiplatelet therapy with aspirin and Plavix because of his history of stent thrombosis.  2. Hyperlipidemia. Last lipids I see on file her from 2011 when he had a cholesterol of 158, LDL 96, and HDL 37. His liver function tests were normal at that time. These will be repeated.  For followup, I would like to see him back in 12 months. He is doing an excellent job with his exercise regimen. He will call if there are any problems in the interim.  [name] 07/01/2012 11:19 AM

## 2012-10-15 ENCOUNTER — Other Ambulatory Visit: Payer: Self-pay

## 2012-10-30 ENCOUNTER — Other Ambulatory Visit: Payer: Self-pay | Admitting: Cardiovascular Disease

## 2013-02-19 ENCOUNTER — Other Ambulatory Visit: Payer: Self-pay | Admitting: Cardiovascular Disease

## 2013-03-14 ENCOUNTER — Other Ambulatory Visit: Payer: Self-pay | Admitting: Cardiovascular Disease

## 2013-04-05 ENCOUNTER — Other Ambulatory Visit: Payer: Self-pay

## 2013-04-07 ENCOUNTER — Other Ambulatory Visit: Payer: Self-pay | Admitting: Cardiovascular Disease

## 2013-04-21 ENCOUNTER — Ambulatory Visit (INDEPENDENT_AMBULATORY_CARE_PROVIDER_SITE_OTHER): Payer: Medicare Other | Admitting: Cardiovascular Disease

## 2013-04-21 ENCOUNTER — Encounter: Payer: Self-pay | Admitting: Cardiovascular Disease

## 2013-04-21 VITALS — BP 120/84 | HR 63 | Ht 72.0 in | Wt 164.0 lb

## 2013-04-21 DIAGNOSIS — I2510 Atherosclerotic heart disease of native coronary artery without angina pectoris: Secondary | ICD-10-CM

## 2013-04-21 DIAGNOSIS — E7800 Pure hypercholesterolemia, unspecified: Secondary | ICD-10-CM

## 2013-04-21 LAB — HEPATIC FUNCTION PANEL
AST: 23 U/L (ref 0–37)
Albumin: 4.5 g/dL (ref 3.5–5.2)
Alkaline Phosphatase: 64 U/L (ref 39–117)

## 2013-04-21 LAB — LIPID PANEL
Cholesterol: 120 mg/dL (ref 0–200)
Total CHOL/HDL Ratio: 3
Triglycerides: 82 mg/dL (ref 0.0–149.0)

## 2013-04-21 LAB — BASIC METABOLIC PANEL
BUN: 10 mg/dL (ref 6–23)
CO2: 28 mEq/L (ref 19–32)
Calcium: 9.5 mg/dL (ref 8.4–10.5)
Creatinine, Ser: 0.9 mg/dL (ref 0.4–1.5)

## 2013-04-21 NOTE — Patient Instructions (Addendum)
Your physician has requested that you have an exercise stress myoview. For further information please visit [email or web address] Please follow instruction sheet, as given.  Your physician recommends that you have lab work today: BMP, LIVER, LIPID, CK  Your physician recommends that you continue on your current medications as directed. Please refer to the Current Medication list given to you today.  Your physician wants you to follow-up in: 6 MONTHS with Dr Excell Seltzer.  You will receive a reminder letter in the mail two months in advance. If you don't receive a letter, please call our office to schedule the follow-up appointment.

## 2013-04-21 NOTE — Progress Notes (Signed)
   HPI:  66-year-old gentleman presenting for followup evaluation. Patient has coronary artery disease with history of inferior wall MI in 2009. This was related to stent thrombosis. He has had no problems since that time. He's been engaged in regular exercise without exertional symptoms. Heart catheterization in 2010 demonstrated wide patency of his right coronary artery stent. He had mild to moderate restenosis of a right posterior descending artery stent and medical therapy was recommended.  The patient has not been doing as well lately. He is an avid walker. He's had more difficulty with exercise over the last 2 months. He does not have exertional chest pain or pressure, but he complains of diaphoresis with less activity. He also has had some shortness of breath. He also has had episodic nausea. He describes chest pain when he is lying on his left side. He feels like his legs are weak. He is very concerned about his symptoms.  Outpatient Encounter Prescriptions as of 04/21/2013  Medication Sig Dispense Refill  . aspirin 325 MG tablet Take 325 mg by mouth daily.        . clopidogrel (PLAVIX) 75 MG tablet TAKE 1 TABLET DAILY  90 tablet  3  . niacin (NIASPAN) 750 MG CR tablet TAKE 2 TABLETS AT BEDTIME  180 tablet  2  . Omega-3 Fatty Acids (OMEGA-3 FISH OIL PO) Take by mouth. 10-12 grams daily       . simvastatin (ZOCOR) 40 MG tablet Take 20 mg by mouth daily.       . [DISCONTINUED] simvastatin (ZOCOR) 40 MG tablet TAKE 1 TABLET ONCE DAILY  90 tablet  3   No facility-administered encounter medications on file as of 04/21/2013.    No Known Allergies  Past Medical History  Diagnosis Date  . Myocardial infarct 09-11-06  . Coronary artery disease   . Hyperlipidemia     ROS: Negative except as per HPI  BP 120/84  Pulse 63  Ht 6' (1.829 m)  Wt 164 lb (74.39 kg)  BMI 22.24 kg/m2  SpO2 98%  PHYSICAL EXAM: Pt is alert and oriented, NAD HEENT: normal Neck: JVP - normal, carotids 2+= without  bruits Lungs: CTA bilaterally CV: RRR without murmur or gallop Abd: soft, NT, Positive BS, no hepatomegaly Ext: no C/C/E, distal pulses intact and equal Skin: warm/dry no rash  EKG:  Normal sinus rhythm 63 beats per minute, within normal limits.  ASSESSMENT AND PLAN: 1. Coronary artery disease, native vessel. The patient is experiencing symptoms of exercise intolerance, atypical chest pain,and shortness of breath. I think he is very anxious about his symptoms, but he does feel like these are heart related. We will check an exercise Myoview stress scan to make sure that he is not having active ischemia. Further plans pending the results of stress test. He should continue on dual antiplatelet therapy with aspirin and Plavix with his history of stent thrombosis several years ago.  2. Hyperlipidemia. Labs were checked today. Also checked a CK level because of his muscle weakness.  Ammie Warrick 04/21/2013 6:13 PM      

## 2013-05-01 HISTORY — PX: CORONARY ANGIOPLASTY WITH STENT PLACEMENT: SHX49

## 2013-05-03 ENCOUNTER — Ambulatory Visit (HOSPITAL_COMMUNITY): Payer: Medicare Other | Attending: Cardiovascular Disease | Admitting: Radiology

## 2013-05-03 ENCOUNTER — Encounter (HOSPITAL_COMMUNITY): Payer: Self-pay | Admitting: Pharmacy Technician

## 2013-05-03 VITALS — BP 129/74 | Ht 72.0 in | Wt 164.0 lb

## 2013-05-03 DIAGNOSIS — Z87891 Personal history of nicotine dependence: Secondary | ICD-10-CM | POA: Insufficient documentation

## 2013-05-03 DIAGNOSIS — E785 Hyperlipidemia, unspecified: Secondary | ICD-10-CM | POA: Insufficient documentation

## 2013-05-03 DIAGNOSIS — Z9861 Coronary angioplasty status: Secondary | ICD-10-CM | POA: Insufficient documentation

## 2013-05-03 DIAGNOSIS — I252 Old myocardial infarction: Secondary | ICD-10-CM | POA: Insufficient documentation

## 2013-05-03 DIAGNOSIS — R110 Nausea: Secondary | ICD-10-CM | POA: Insufficient documentation

## 2013-05-03 DIAGNOSIS — Z8249 Family history of ischemic heart disease and other diseases of the circulatory system: Secondary | ICD-10-CM | POA: Insufficient documentation

## 2013-05-03 DIAGNOSIS — R5381 Other malaise: Secondary | ICD-10-CM | POA: Insufficient documentation

## 2013-05-03 DIAGNOSIS — R0602 Shortness of breath: Secondary | ICD-10-CM | POA: Insufficient documentation

## 2013-05-03 DIAGNOSIS — E7800 Pure hypercholesterolemia, unspecified: Secondary | ICD-10-CM

## 2013-05-03 DIAGNOSIS — R079 Chest pain, unspecified: Secondary | ICD-10-CM | POA: Insufficient documentation

## 2013-05-03 DIAGNOSIS — I2510 Atherosclerotic heart disease of native coronary artery without angina pectoris: Secondary | ICD-10-CM

## 2013-05-03 DIAGNOSIS — R61 Generalized hyperhidrosis: Secondary | ICD-10-CM | POA: Insufficient documentation

## 2013-05-03 MED ORDER — TECHNETIUM TC 99M SESTAMIBI GENERIC - CARDIOLITE
33.0000 | Freq: Once | INTRAVENOUS | Status: AC | PRN
  Administered 2013-05-03: 33 via INTRAVENOUS

## 2013-05-03 MED ORDER — TECHNETIUM TC 99M SESTAMIBI GENERIC - CARDIOLITE
11.0000 | Freq: Once | INTRAVENOUS | Status: AC | PRN
  Administered 2013-05-03: 11 via INTRAVENOUS

## 2013-05-03 NOTE — Progress Notes (Signed)
[facility] SITE 3 NUCLEAR MED [address] [place], [place] [address] [phone]    Cardiology Nuclear Med Study  [name] is a 67 y.o. male     MRN : [id number]     DOB: [birth year]  Procedure Date: 05/03/2013  Nuclear Med Background Indication for Stress Test:  Evaluation for Ischemia and PTCA/Stent Patency History:  '05 Stents RCA,PDA, '08 Angioplasty, '09 MI, '10 Heart Cath-RCA stent patent mild to mod restenosis of PDA  stent N/O CAD EF: 60% '12 MPS: EF:58% (-) ischemia Cardiac Risk Factors: Family History - CAD, History of Smoking and Lipids  Symptoms:  Chest Pain, Diaphoresis, Fatigue, Nausea and SOB   Nuclear Pre-Procedure Caffeine/Decaff Intake:  None > 12 hrs NPO After: 7:30pm   Lungs:  clear O2 Sat: 96% on room air. IV 0.9% NS with Angio Cath:  22g  IV Site: R Forearm x 1, tolerated well IV Started by:  Irean Hong, RN  Chest Size (in):  40 Cup Size: n/a  Height: 6' (1.829 m)  Weight:  164 lb (74.39 kg)  BMI:  Body mass index is 22.24 kg/(m^2). Tech Comments:  No medications today. Pictures checked, (+) Dr.Cooper consulted, and scheduled a Heart Cath for 9/11.     Nuclear Med Study 1 or 2 day study: 1 day  Stress Test Type:  Stress  Reading MD: Kristeen Miss, MD  Order Authorizing Provider:  Tonny Bollman, MD  Resting Radionuclide: Technetium 59m Sestamibi  Resting Radionuclide Dose: 11.0 mCi   Stress Radionuclide:  Technetium 64m Sestamibi  Stress Radionuclide Dose: 33.0 mCi           Stress Protocol Rest HR: 75 Stress HR: 150  Rest BP: 129/74 Stress BP: 181/75  Exercise Time (min): 4:15 METS: 6.10   Predicted Max HR: 154 bpm % Max HR: 97.4 bpm Rate Pressure Product: 69629   Dose of Adenosine (mg):  n/a Dose of Lexiscan: n/a mg  Dose of Atropine (mg): n/a Dose of Dobutamine: n/a mcg/kg/min (at max HR)  Stress Test Technologist: Milana Na, EMT-P  Nuclear Technologist:  Domenic Polite, CNMT     Rest Procedure:  Myocardial  perfusion imaging was performed at rest 45 minutes following the intravenous administration of Technetium 19m Sestamibi. Rest ECG: NSR, NS ST abnormality in the lateral leads.   Stress Procedure:  The patient exercised on the treadmill utilizing the Bruce Protocol for 4:15 minutes. The patient stopped due to fatigue,sob and denied any chest pain.  Technetium 58m Sestamibi was injected at peak exercise and myocardial perfusion imaging was performed after a brief delay. Stress ECG: There was some worsening of the pre-existing ST abnormalities in the lateral leads.  There are also PVCs.  QPS Raw Data Images:  Normal; no motion artifact; normal heart/lung ratio. Stress Images:  There is a medium sized area of moderatly severe attenuation of the mid and distal inferior wall.  The uptake in the remaining regions is well preserved.   Rest Images:  There is a medium sized area of mildly severe attenuation of the mid and distal inferior wall.  The uptake in the remaining regions is well preserved.  Subtraction (SDS):  There appears to be a subendocardial Inferior wall MI with some degree of peri-infarct ischemia.  Transient Ischemic Dilatation (Normal <1.22):  n/a Lung/Heart Ratio (Normal <0.45):  0.36  Quantitative Gated Spect Images QGS EDV:  80 ml QGS ESV:  33 ml  Impression Exercise Capacity:  Fair exercise capacity. BP Response:  Normal blood pressure response. Clinical Symptoms:  No significant symptoms noted. ECG Impression:  Insignificant upsloping ST segment depression. Comparison with Prior Nuclear Study: Since previous study 06/29/11, there has been slight worsening of the ischemia in the inferior wall.    Overall Impression:  Intermediate risk stress nuclear study .  The patient has known disease in the inferior wall.  there is evidence of a previous inferior wall subendocardial MI with peri-infarct ischemia. .  LV Ejection Fraction: 59%.  LV Wall Motion:  NL LV Function; NL Wall  Motion.   [name], [name]., MD, [facility] 05/03/2013, 4:54 PM Office - [phone] Pager [phone]

## 2013-05-04 ENCOUNTER — Other Ambulatory Visit: Payer: Self-pay | Admitting: Cardiovascular Disease

## 2013-05-04 DIAGNOSIS — I2510 Atherosclerotic heart disease of native coronary artery without angina pectoris: Secondary | ICD-10-CM

## 2013-05-04 MED ORDER — SODIUM BICARBONATE 8.4 % IV SOLN: INTRAVENOUS | Status: AC

## 2013-05-04 MED ORDER — SODIUM BICARBONATE BOLUS VIA INFUSION: INTRAVENOUS | Status: AC

## 2013-05-04 MED ORDER — DIAZEPAM 2 MG PO TABS: 5.0000 mg | ORAL_TABLET | ORAL | Status: AC

## 2013-05-04 MED ORDER — SODIUM CHLORIDE 0.9 % IJ SOLN: 3.0000 mL | Freq: Two times a day (BID) | INTRAMUSCULAR | Status: DC

## 2013-05-04 MED ORDER — SODIUM CHLORIDE 0.9 % IJ SOLN: 3.0000 mL | INTRAMUSCULAR | Status: DC | PRN

## 2013-05-04 MED ORDER — SODIUM CHLORIDE 0.9 % IV SOLN: 250.0000 mL | INTRAVENOUS | Status: DC | PRN

## 2013-05-05 ENCOUNTER — Other Ambulatory Visit: Payer: Self-pay | Admitting: Cardiovascular Disease

## 2013-05-05 DIAGNOSIS — I2510 Atherosclerotic heart disease of native coronary artery without angina pectoris: Secondary | ICD-10-CM

## 2013-05-11 ENCOUNTER — Encounter (HOSPITAL_COMMUNITY)
Admission: RE | Disposition: A | Discharge status: Home / Self Care | Payer: Self-pay | Source: Ambulatory Visit | Attending: Cardiovascular Disease

## 2013-05-11 ENCOUNTER — Ambulatory Visit (HOSPITAL_COMMUNITY)
Admission: RE | Admit: 2013-05-11 | Discharge: 2013-05-12 | Disposition: A | Discharge status: Home / Self Care | Payer: Medicare Other | Source: Ambulatory Visit | Attending: Cardiovascular Disease | Admitting: Cardiovascular Disease

## 2013-05-11 ENCOUNTER — Encounter (HOSPITAL_COMMUNITY): Payer: Self-pay | Admitting: *Deleted

## 2013-05-11 DIAGNOSIS — I2510 Atherosclerotic heart disease of native coronary artery without angina pectoris: Secondary | ICD-10-CM

## 2013-05-11 DIAGNOSIS — E7800 Pure hypercholesterolemia, unspecified: Secondary | ICD-10-CM

## 2013-05-11 DIAGNOSIS — I252 Old myocardial infarction: Secondary | ICD-10-CM | POA: Insufficient documentation

## 2013-05-11 DIAGNOSIS — Z7902 Long term (current) use of antithrombotics/antiplatelets: Secondary | ICD-10-CM | POA: Insufficient documentation

## 2013-05-11 DIAGNOSIS — T82897A Other specified complication of cardiac prosthetic devices, implants and grafts, initial encounter: Secondary | ICD-10-CM | POA: Insufficient documentation

## 2013-05-11 DIAGNOSIS — I209 Angina pectoris, unspecified: Secondary | ICD-10-CM | POA: Insufficient documentation

## 2013-05-11 DIAGNOSIS — E785 Hyperlipidemia, unspecified: Secondary | ICD-10-CM | POA: Insufficient documentation

## 2013-05-11 DIAGNOSIS — Y831 Surgical operation with implant of artificial internal device as the cause of abnormal reaction of the patient, or of later complication, without mention of misadventure at the time of the procedure: Secondary | ICD-10-CM | POA: Insufficient documentation

## 2013-05-11 DIAGNOSIS — R0602 Shortness of breath: Secondary | ICD-10-CM | POA: Insufficient documentation

## 2013-05-11 HISTORY — PX: LEFT HEART CATHETERIZATION WITH CORONARY ANGIOGRAM: SHX5451

## 2013-05-11 LAB — CBC
MCH: 32.8 pg (ref 26.0–34.0)
MCHC: 34.4 g/dL (ref 30.0–36.0)
MCV: 95.4 fL (ref 78.0–100.0)
Platelets: 171 10*3/uL (ref 150–400)
RBC: 4.57 MIL/uL (ref 4.22–5.81)

## 2013-05-11 LAB — BASIC METABOLIC PANEL
CO2: 25 mEq/L (ref 19–32)
Calcium: 9.8 mg/dL (ref 8.4–10.5)
Creatinine, Ser: 0.93 mg/dL (ref 0.50–1.35)
GFR calc non Af Amer: 86 mL/min — ABNORMAL LOW (ref >90)
Glucose, Bld: 116 mg/dL — ABNORMAL HIGH (ref 70–99)

## 2013-05-11 LAB — PROTIME-INR
INR: 0.93 (ref 0.00–1.49)
Prothrombin Time: 12.3 seconds (ref 11.6–15.2)

## 2013-05-11 LAB — POCT ACTIVATED CLOTTING TIME: Activated Clotting Time: 263 seconds

## 2013-05-11 SURGERY — LEFT HEART CATHETERIZATION WITH CORONARY ANGIOGRAM
Anesthesia: LOCAL

## 2013-05-11 MED ORDER — SODIUM CHLORIDE 0.9 % IJ SOLN: 3.0000 mL | Freq: Two times a day (BID) | INTRAMUSCULAR | Status: DC

## 2013-05-11 MED ORDER — DIAZEPAM 5 MG PO TABS
ORAL_TABLET | ORAL | Status: AC
  Filled 2013-05-11: qty 1

## 2013-05-11 MED ORDER — HEPARIN (PORCINE) IN NACL 2-0.9 UNIT/ML-% IJ SOLN
INTRAMUSCULAR | Status: AC
  Filled 2013-05-11: qty 1000

## 2013-05-11 MED ORDER — INFLUENZA VAC SPLIT QUAD 0.5 ML IM SUSP
0.5000 mL | INTRAMUSCULAR | Status: AC
  Administered 2013-05-12: 09:00:00 0.5 mL via INTRAMUSCULAR
  Filled 2013-05-11: qty 0.5

## 2013-05-11 MED ORDER — MIDAZOLAM HCL 2 MG/2ML IJ SOLN
INTRAMUSCULAR | Status: AC
  Filled 2013-05-11: qty 2

## 2013-05-11 MED ORDER — NIACIN ER (ANTIHYPERLIPIDEMIC) 500 MG PO TBCR
1500.0000 mg | EXTENDED_RELEASE_TABLET | Freq: Every day | ORAL | Status: DC
  Administered 2013-05-11: 1500 mg via ORAL
  Filled 2013-05-11 (×3): qty 3

## 2013-05-11 MED ORDER — ONDANSETRON HCL 4 MG/2ML IJ SOLN: 4.0000 mg | Freq: Four times a day (QID) | INTRAMUSCULAR | Status: DC | PRN

## 2013-05-11 MED ORDER — CLOPIDOGREL BISULFATE 75 MG PO TABS
75.0000 mg | ORAL_TABLET | Freq: Every day | ORAL | Status: DC
  Administered 2013-05-11: 75 mg via ORAL
  Filled 2013-05-11 (×2): qty 1

## 2013-05-11 MED ORDER — SODIUM CHLORIDE 0.9 % IJ SOLN: 3.0000 mL | INTRAMUSCULAR | Status: DC | PRN

## 2013-05-11 MED ORDER — SODIUM CHLORIDE 0.9 % IV SOLN
INTRAVENOUS | Status: DC
  Administered 2013-05-11: 06:00:00 via INTRAVENOUS

## 2013-05-11 MED ORDER — LIDOCAINE HCL (PF) 1 % IJ SOLN
INTRAMUSCULAR | Status: AC
  Filled 2013-05-11: qty 30

## 2013-05-11 MED ORDER — FENTANYL CITRATE 0.05 MG/ML IJ SOLN
INTRAMUSCULAR | Status: AC
  Filled 2013-05-11: qty 2

## 2013-05-11 MED ORDER — SODIUM CHLORIDE 0.9 % IV SOLN: 250.0000 mL | INTRAVENOUS | Status: DC | PRN

## 2013-05-11 MED ORDER — SODIUM CHLORIDE 0.9 % IJ SOLN
3.0000 mL | Freq: Two times a day (BID) | INTRAMUSCULAR | Status: DC
  Administered 2013-05-11: 18:00:00 3 mL via INTRAVENOUS

## 2013-05-11 MED ORDER — ACETAMINOPHEN 325 MG PO TABS: 650.0000 mg | ORAL_TABLET | ORAL | Status: DC | PRN

## 2013-05-11 MED ORDER — SIMVASTATIN 20 MG PO TABS
20.0000 mg | ORAL_TABLET | Freq: Every day | ORAL | Status: DC
  Administered 2013-05-11: 18:00:00 20 mg via ORAL
  Filled 2013-05-11 (×2): qty 1

## 2013-05-11 MED ORDER — ASPIRIN 325 MG PO TABS
325.0000 mg | ORAL_TABLET | Freq: Every day | ORAL | Status: DC
  Administered 2013-05-12: 09:00:00 325 mg via ORAL
  Filled 2013-05-11: qty 1

## 2013-05-11 MED ORDER — SODIUM CHLORIDE 0.9 % IV SOLN: 1.0000 mL/kg/h | INTRAVENOUS | Status: AC

## 2013-05-11 MED ORDER — NITROGLYCERIN 0.2 MG/ML ON CALL CATH LAB
INTRAVENOUS | Status: AC
  Filled 2013-05-11: qty 1

## 2013-05-11 MED ORDER — DIAZEPAM 5 MG PO TABS
5.0000 mg | ORAL_TABLET | ORAL | Status: AC
  Administered 2013-05-11: 5 mg via ORAL

## 2013-05-11 MED ORDER — VERAPAMIL HCL 2.5 MG/ML IV SOLN
INTRAVENOUS | Status: AC
  Filled 2013-05-11: qty 2

## 2013-05-11 NOTE — Interval H&P Note (Signed)
History and Physical Interval Note:  05/11/2013 7:50 AM  Woodroe Mode  has presented today for surgery, with the diagnosis of Chest pain  The various methods of treatment have been discussed with the patient and family. After consideration of risks, benefits and other options for treatment, the patient has consented to  Procedure(s): LEFT HEART CATHETERIZATION WITH CORONARY ANGIOGRAM (N/A) as a surgical intervention .  The patient's history has been reviewed, patient examined, no change in status, stable for surgery.  I have reviewed the patient's chart and labs.  Questions were answered to the patient's satisfaction.    Cath Lab Visit (complete for each Cath Lab visit)  Clinical Evaluation Leading to the Procedure:   ACS: no  Non-ACS:    Anginal Classification: CCS III  Anti-ischemic medical therapy: No Therapy  Non-Invasive Test Results: Intermediate-risk stress test findings: cardiac mortality 1-3%/year  Prior CABG: No previous CABG         Tonny Bollman

## 2013-05-11 NOTE — H&P (View-Only) (Signed)
   HPI:  67 year old gentleman presenting for followup evaluation. Patient has coronary artery disease with history of inferior wall MI in 2009. This was related to stent thrombosis. He has had no problems since that time. He's been engaged in regular exercise without exertional symptoms. Heart catheterization in 2010 demonstrated wide patency of his right coronary artery stent. He had mild to moderate restenosis of a right posterior descending artery stent and medical therapy was recommended.  The patient has not been doing as well lately. He is an avid walker. He's had more difficulty with exercise over the last 2 months. He does not have exertional chest pain or pressure, but he complains of diaphoresis with less activity. He also has had some shortness of breath. He also has had episodic nausea. He describes chest pain when he is lying on his left side. He feels like his legs are weak. He is very concerned about his symptoms.  Outpatient Encounter Prescriptions as of 04/21/2013  Medication Sig Dispense Refill  . aspirin 325 MG tablet Take 325 mg by mouth daily.        . clopidogrel (PLAVIX) 75 MG tablet TAKE 1 TABLET DAILY  90 tablet  3  . niacin (NIASPAN) 750 MG CR tablet TAKE 2 TABLETS AT BEDTIME  180 tablet  2  . Omega-3 Fatty Acids (OMEGA-3 FISH OIL PO) Take by mouth. 10-12 grams daily       . simvastatin (ZOCOR) 40 MG tablet Take 20 mg by mouth daily.       . [DISCONTINUED] simvastatin (ZOCOR) 40 MG tablet TAKE 1 TABLET ONCE DAILY  90 tablet  3   No facility-administered encounter medications on file as of 04/21/2013.    No Known Allergies  Past Medical History  Diagnosis Date  . Myocardial infarct 09-11-06  . Coronary artery disease   . Hyperlipidemia     ROS: Negative except as per HPI  BP 120/84  Pulse 63  Ht 6' (1.829 m)  Wt 164 lb (74.39 kg)  BMI 22.24 kg/m2  SpO2 98%  PHYSICAL EXAM: Pt is alert and oriented, NAD HEENT: normal Neck: JVP - normal, carotids 2+= without  bruits Lungs: CTA bilaterally CV: RRR without murmur or gallop Abd: soft, NT, Positive BS, no hepatomegaly Ext: no C/C/E, distal pulses intact and equal Skin: warm/dry no rash  EKG:  Normal sinus rhythm 63 beats per minute, within normal limits.  ASSESSMENT AND PLAN: 1. Coronary artery disease, native vessel. The patient is experiencing symptoms of exercise intolerance, atypical chest pain,and shortness of breath. I think he is very anxious about his symptoms, but he does feel like these are heart related. We will check an exercise Myoview stress scan to make sure that he is not having active ischemia. Further plans pending the results of stress test. He should continue on dual antiplatelet therapy with aspirin and Plavix with his history of stent thrombosis several years ago.  2. Hyperlipidemia. Labs were checked today. Also checked a CK level because of his muscle weakness.  [name] 04/21/2013 6:13 PM

## 2013-05-11 NOTE — CV Procedure (Signed)
   Cardiac Catheterization Procedure Note  Name: BHAVYA GRAND MRN: [id number] DOB: 02-17-1946  Procedure: Left Heart Cath, Selective Coronary Angiography, PTCA and stenting of the right PDA  Indication: CCS Class Angina, moderate risk stress perfusion study  Procedural Details:  The left wrist was prepped, draped, and anesthetized with 1% lidocaine. Using the modified Seldinger technique, a 5/6 French slender sheath was introduced into the right radial artery. 3 mg of verapamil was administered through the sheath, weight-based unfractionated heparin was administered intravenously. Standard Judkins catheters were used for selective coronary angiography. Catheter exchanges were performed over an exchange length guidewire.  PROCEDURAL FINDINGS Hemodynamics: AO 108/68 LV 108/11   Coronary angiography: Coronary dominance: right  Left mainstem: Patent with mild diffuse irregularity. There is mild pressure dampening and diffuse 20-30% stenosis.   Left anterior descending (LAD): Patent to the LV apex, normal caliber vessel, no obstructive disease. There is mild 20% stenosis in the proximal LAD  Left circumflex (LCx): Medium-caliber vessel. No obstructive disease.  Right coronary artery (RCA): Patent stents throughout without significant ISR. There are areas of minor restenosis but no more than 20-30%. The PDA is stented and there is very tight 95% stenosis in the mid-portion of the stent.  Left ventriculography: not done, pressures recorded only  PCI Note:  Following the diagnostic procedure, the decision was made to proceed with PCI.  Additional heparin was given for anticoagulation. Once a therapeutic ACT was achieved, a 6 [place] JR-4 guide catheter was inserted.  A BMW coronary guidewire was used to cross the lesion.  The lesion was predilated with a 2.5x12 balloon.  The lesion was then stented with a 2.5x16 mm Promus Premier drug-eluting stent.  The stent was postdilated with a 2.75  noncompliant balloon to 24 atm.  Following PCI, there was 10% residual stenosis and TIMI-3 flow. Final angiography confirmed an excellent result. The patient tolerated the procedure well. There were no immediate procedural complications. A TR band was used for radial hemostasis. The patient was transferred to the post catheterization recovery area for further monitoring.  PCI Data: Vessel - PDA/Segment - proximal (in-stent) Percent Stenosis (pre)  95 TIMI-flow 3 Stent 2.5x16 Promus DES Percent Stenosis (post) 10 TIMI-flow (post) 3  Final Conclusions:   1. Severe single vessel CAD with successful PCI of the right PDA using a drug-eluting stent 2. Mild nonobstructive stenosis of the left main, LAD, and LCx   Recommendations:  Continue long-term DAPT with ASA and plavix.  Tonny Bollman 05/11/2013, 1:27 PM

## 2013-05-12 DIAGNOSIS — I209 Angina pectoris, unspecified: Secondary | ICD-10-CM

## 2013-05-12 LAB — CBC
HCT: 39.1 % (ref 39.0–52.0)
Hemoglobin: 13.9 g/dL (ref 13.0–17.0)
MCV: 94.7 fL (ref 78.0–100.0)
RBC: 4.13 MIL/uL — ABNORMAL LOW (ref 4.22–5.81)
WBC: 6 10*3/uL (ref 4.0–10.5)

## 2013-05-12 LAB — BASIC METABOLIC PANEL
BUN: 12 mg/dL (ref 6–23)
BUN: 11 mg/dL (ref 6–23)
CO2: 28 mEq/L (ref 19–32)
CO2: 28 mEq/L (ref 19–32)
Calcium: 10 mg/dL (ref 8.4–10.5)
Chloride: 104 mEq/L (ref 96–112)
Creatinine, Ser: 0.97 mg/dL (ref 0.50–1.35)
Creatinine, Ser: 0.87 mg/dL (ref 0.50–1.35)
Glucose, Bld: 125 mg/dL — ABNORMAL HIGH (ref 70–99)
Glucose, Bld: 113 mg/dL — ABNORMAL HIGH (ref 70–99)
Potassium: 5.4 mEq/L — ABNORMAL HIGH (ref 3.5–5.1)

## 2013-05-12 MED ORDER — NITROGLYCERIN 0.4 MG SL SUBL: 0.4000 mg | SUBLINGUAL_TABLET | SUBLINGUAL | Status: DC | PRN

## 2013-05-12 NOTE — Progress Notes (Signed)
    Subjective:  Feels good. No CP or dyspnea.   Objective:  Vital Signs in the last 24 hours: Temp:  [97.7 F (36.5 C)-98.2 F (36.8 C)] 97.7 F (36.5 C) (09/12 0814) Pulse Rate:  [63-82] 64 (09/12 0814) Resp:  [16-18] 16 (09/12 0814) BP: (100-134)/(53-92) 111/73 mmHg (09/12 0814) SpO2:  [96 %-99 %] 98 % (09/12 0814) Weight:  [166 lb 7.2 oz (75.5 kg)] 166 lb 7.2 oz (75.5 kg) (09/12 0000)  Intake/Output from previous day: 09/11 0701 - 09/12 0700 In: 840 [P.O.:840] Out: -   Physical Exam: Pt is alert and oriented, NAD HEENT: normal Neck: JVP - normal Lungs: CTA bilaterally CV: RRR without murmur or gallop Abd: soft, NT, Positive BS, no hepatomegaly Ext: no C/C/E, distal pulses intact and equal Skin: warm/dry no rash  Lab Results:  Recent Labs  05/11/13 0600 05/12/13 0417  WBC 5.4 6.0  HGB 15.0 13.9  PLT 171 164    Recent Labs  05/11/13 0600 05/12/13 0417  NA 139 138  K 3.9 5.4*  CL 102 104  CO2 25 28  GLUCOSE 116* 125*  BUN 12 12  CREATININE 0.93 0.97   No results found for this basename: TROPONINI, CK, MB,  in the last 72 hours  Tele: Sinus rhythm, personally reviewed  Assessment/Plan:  CAD - CCS Class 3 angina. S/P PCI yesterday for treatment of severe in-stent restenosis. meds and instructions reviewed with patient in-depth. He has been through this in past and is well-educated on post-PCI instructions.   Tonny Bollman, M.D. 05/12/2013, 10:10 AM

## 2013-05-12 NOTE — Discharge Summary (Signed)
CARDIOLOGY DISCHARGE SUMMARY   Patient ID: ACEA YAGI MRN: [id number] DOB/AGE: 1945/09/02 67 y.o.  Admit date: 05/11/2013 Discharge date: 05/12/2013  Primary Discharge Diagnosis:   Class III angina - s/p 2.5x16 mm Promus Premier drug-eluting stent to the PDA  Secondary Discharge Diagnosis:    CORONARY ATHEROSCLEROSIS NATIVE CORONARY ARTERY  Procedures: Left Heart Cath, Selective Coronary Angiography, PTCA and stenting of the right PDA    Hospital Course: [name] is a 67 y.o. male with a history of CAD. He was seen in the office with symptoms concerning for progressive anginal pain. A stress study was performed which showed peri-infarct ischemia and preserved ejection fraction. He was scheduled for cardiac catheterization and came to the hospital for the procedure on 05/11/2013.  Cath results are below. He received a drug-eluting stent to the PDA and tolerated the procedure well.  On 05/12/2013, he was seen by cardiac rehabilitation and by Dr. Excell Seltzer. His labs were reviewed. His potassium was elevated but was hemolyzed and was repeated. As his blood sugars were noted to be consistently elevated and a hemoglobin A1c was drawn. It is pending at the time of dictation.  He is ambulating without chest pain or shortness of breath. He was educated on stent restrictions, heart-healthy lifestyle changes and exercise guidelines. He was evaluated by Dr. Excell Seltzer and considered stable for discharge, to follow up as an outpatient.  Labs:  Lab Results  Component Value Date   WBC 6.0 05/12/2013   HGB 13.9 05/12/2013   HCT 39.1 05/12/2013   MCV 94.7 05/12/2013   PLT 164 05/12/2013     Recent Labs Lab 05/12/13 0417  NA 138  K 5.4*  CL 104  CO2 28  BUN 12  CREATININE 0.97  CALCIUM 9.5  GLUCOSE 125*    Recent Labs  05/11/13 0600  INR 0.93      Cardiac Cath: 05/11/2013 Left mainstem: Patent with mild diffuse irregularity. There is mild pressure dampening and diffuse 20-30% stenosis.   Left anterior descending (LAD): Patent to the LV apex, normal caliber vessel, no obstructive disease. There is mild 20% stenosis in the proximal LAD  Left circumflex (LCx): Medium-caliber vessel. No obstructive disease.  Right coronary artery (RCA): Patent stents throughout without significant ISR. There are areas of minor restenosis but no more than 20-30%. The PDA is stented and there is very tight 95% stenosis in the mid-portion of the stent.  Left ventriculography: not done, pressures recorded only PCI Data:  Vessel - PDA/Segment - proximal (in-stent)  Percent Stenosis (pre) 95  TIMI-flow 3  Stent 2.5x16 Promus DES  Percent Stenosis (post) 10  TIMI-flow (post) 3  Final Conclusions:  1. Severe single vessel CAD with successful PCI of the right PDA using a drug-eluting stent  2. Mild nonobstructive stenosis of the left main, LAD, and LCx  Recommendations:  Continue long-term DAPT with ASA and plavix.  EKG:  05/12/2013 Vent. rate 67 BPM PR interval 160 ms QRS duration 82 ms QT/QTc 430/454 ms P-R-T axes 39 75 72   FOLLOW UP PLANS AND APPOINTMENTS No Known Allergies   Medication List         aspirin 325 MG tablet  Take 325 mg by mouth daily.     clopidogrel 75 MG tablet  Commonly known as:  PLAVIX  Take 75 mg by mouth daily.     niacin 750 MG CR tablet  Commonly known as:  NIASPAN  Take 1,500 mg by mouth at bedtime.  nitroGLYCERIN 0.4 MG SL tablet  Commonly known as:  NITROSTAT  Place 1 tablet (0.4 mg total) under the tongue every 5 (five) minutes as needed for chest pain.     OMEGA-3 FISH OIL PO  Take 1,000-1,200 mg by mouth daily.     simvastatin 40 MG tablet  Commonly known as:  ZOCOR  Take 20 mg by mouth daily.        Discharge Orders   Future Appointments Provider Department Dept Phone   05/30/2013 1:30 PM Rosalio Macadamia, NP [facility] Main Office [place]) [phone]   Future Orders Complete By Expires   Diet - low sodium heart healthy  As  directed    Increase activity slowly  As directed      Follow-up Information   Follow up with Norma Fredrickson, NP On 05/30/2013. (See for Dr. Excell Seltzer at 1:30 pm)    Specialty:  Nurse Practitioner   Contact information:   [address] [place] [address] [phone]       BRING ALL MEDICATIONS WITH YOU TO FOLLOW UP APPOINTMENTS  Time spent with patient to include physician time: 39 min Signed: Theodore Demark, PA-C 05/12/2013, 10:40 AM Co-Sign MD

## 2013-05-12 NOTE — Progress Notes (Deleted)
Wrong note ................................................................................................... 

## 2013-05-12 NOTE — Progress Notes (Signed)
CARDIAC REHAB PHASE I   PRE:  Rate/Rhythm: 69 SR    BP: sitting 116/73    SaO2:   MODE:  Ambulation: 1000 ft   POST:  Rate/Rhythm: 87 SR    BP: sitting 111/73     SaO2:   Tolerated well, no sx. Sts he feels better. Reviewed ed. Pt compliant with diet and ex. Encouraged carrying NTG. Not interested in CRPII, will ex on his own. [id number]   Elissa Lovett Garwin CES, ACSM 05/12/2013 8:11 AM

## 2013-05-30 ENCOUNTER — Encounter: Payer: Self-pay | Admitting: Nurse Practitioner

## 2013-05-30 ENCOUNTER — Ambulatory Visit (INDEPENDENT_AMBULATORY_CARE_PROVIDER_SITE_OTHER): Payer: Medicare Other | Admitting: Nurse Practitioner

## 2013-05-30 VITALS — BP 120/80 | HR 60 | Ht 72.0 in | Wt 162.4 lb

## 2013-05-30 DIAGNOSIS — I209 Angina pectoris, unspecified: Secondary | ICD-10-CM

## 2013-05-30 DIAGNOSIS — I2510 Atherosclerotic heart disease of native coronary artery without angina pectoris: Secondary | ICD-10-CM

## 2013-05-30 LAB — CBC WITH DIFFERENTIAL/PLATELET
Basophils Absolute: 0 10*3/uL (ref 0.0–0.1)
Basophils Relative: 0.4 % (ref 0.0–3.0)
Eosinophils Absolute: 0.2 10*3/uL (ref 0.0–0.7)
Eosinophils Relative: 4.5 % (ref 0.0–5.0)
HCT: 41 % (ref 39.0–52.0)
Hemoglobin: 13.9 g/dL (ref 13.0–17.0)
Lymphocytes Relative: 27.2 % (ref 12.0–46.0)
Lymphs Abs: 1.4 10*3/uL (ref 0.7–4.0)
MCHC: 34.1 g/dL (ref 30.0–36.0)
MCV: 96.4 fl (ref 78.0–100.0)
Monocytes Absolute: 0.3 10*3/uL (ref 0.1–1.0)
Monocytes Relative: 6.7 % (ref 3.0–12.0)
Neutro Abs: 3.1 10*3/uL (ref 1.4–7.7)
Neutrophils Relative %: 61.2 % (ref 43.0–77.0)
Platelets: 182 10*3/uL (ref 150.0–400.0)
RBC: 4.25 Mil/uL (ref 4.22–5.81)
RDW: 13.3 % (ref 11.5–14.6)
WBC: 5 10*3/uL (ref 4.5–10.5)

## 2013-05-30 LAB — BASIC METABOLIC PANEL
BUN: 16 mg/dL (ref 6–23)
CO2: 30 mEq/L (ref 19–32)
Calcium: 10 mg/dL (ref 8.4–10.5)
Chloride: 105 mEq/L (ref 96–112)
Creatinine, Ser: 1.1 mg/dL (ref 0.4–1.5)
GFR: 71 mL/min (ref >60.00)
Glucose, Bld: 99 mg/dL (ref 70–99)
Potassium: 5.1 mEq/L (ref 3.5–5.1)
Sodium: 140 mEq/L (ref 135–145)

## 2013-05-30 LAB — TROPONIN I: Troponin I: <0.3 ng/mL (ref <0.30)

## 2013-05-30 NOTE — Patient Instructions (Addendum)
Continue with your current medicines  We will check labs today  See Dr. Excell Seltzer in 2 months  If you have recurrent chest pain, please let us know.   Call the [facility] office at [phone] if you have any questions, problems or concerns.

## 2013-05-30 NOTE — Progress Notes (Signed)
[name] Date of Birth: [birth year] Medical Record #[id number]  History of Present Illness: [name] is seen back today for a post hospital visit. Seen for Dr. Excell Seltzer. Patient has coronary artery disease with history of inferior wall MI in 2009. This was related to stent thrombosis.  Heart catheterization in 2010 demonstrated wide patency of his right coronary artery stent. He had mild to moderate restenosis of a right posterior descending artery stent and medical therapy was recommended. Other issues include HLD.    Most recently presented with progressive anginal symptoms. Stress test was performed which showed peri-infarct ischemia and preserved EF. Underwent cath and had 2.5 x 16 mm Promus DES to the PDA. On DAPT lifelong. Has had prior stent thrombosis.   Comes back today. Here alone. Says he is doing fine now. Did not feel good 2 days after discharge - started noticing a "grabbing around his heart" - it was fleeting in duration but would keep recurring. He says this was nothing like his chest pain syndrome prior to the stent. It lasted for 4 or 5 days. One day (he can't remember when) felt presyncopal at the grocery store. Saw his PCP afterwards and was told he was ok. Back to his regular activities. Denies any shortness of breath. Not dizzy or lightheaded. Ok on his medicines. No problems with his cath site (left wrist).  Current Outpatient Prescriptions  Medication Sig Dispense Refill  . aspirin 325 MG tablet Take 325 mg by mouth daily.        . clopidogrel (PLAVIX) 75 MG tablet Take 75 mg by mouth daily.      . niacin (NIASPAN) 750 MG CR tablet Take 1,500 mg by mouth at bedtime.      . nitroGLYCERIN (NITROSTAT) 0.4 MG SL tablet Place 1 tablet (0.4 mg total) under the tongue every 5 (five) minutes as needed for chest pain.  25 tablet  3  . Omega-3 Fatty Acids (OMEGA-3 FISH OIL PO) Take 1,000-1,200 mg by mouth daily.       . simvastatin (ZOCOR) 40 MG tablet Take 20 mg by mouth  daily.        No current facility-administered medications for this visit.    No Known Allergies  Past Medical History  Diagnosis Date  . Myocardial infarct 09-11-06  . Coronary artery disease     s/p DES to the right PD in September 2014  . Hyperlipidemia     Past Surgical History  Procedure Laterality Date  . Cardiac catheterization  10-04-08    History  Smoking status  . Former Smoker  Smokeless tobacco  . Not on file    History  Alcohol Use No    Family History  Problem Relation Age of Onset  . Heart attack Father 49    died  . Coronary artery disease      siblings    Review of Systems: The review of systems is per the HPI.  All other systems were reviewed and are negative.  Physical Exam: BP 120/80  Pulse 60  Ht 6' (1.829 m)  Wt 162 lb 6.4 oz (73.664 kg)  BMI 22.02 kg/m2 Patient is very pleasant and in no acute distress. Skin is warm and dry. Color is normal.  HEENT is unremarkable. Normocephalic/atraumatic. PERRL. Sclera are nonicteric. Neck is supple. No masses. No JVD. Lungs are clear. Cardiac exam shows a regular rate and rhythm. Abdomen is soft. Extremities are without edema. Gait and ROM are intact. No  gross neurologic deficits noted.  LABORATORY DATA: EKG shows sinus rhythm, inferior Q's with nonspecific changes - tracing is unchanged from prior EKG. Lab Results  Component Value Date   WBC 6.0 05/12/2013   HGB 13.9 05/12/2013   HCT 39.1 05/12/2013   PLT 164 05/12/2013   GLUCOSE 113* 05/12/2013   CHOL 120 04/21/2013   TRIG 82.0 04/21/2013   HDL 40.40 04/21/2013   LDLCALC 63 04/21/2013   ALT 30 04/21/2013   AST 23 04/21/2013   NA 137 05/12/2013   K 3.8 05/12/2013   CL 99 05/12/2013   CREATININE 0.87 05/12/2013   BUN 11 05/12/2013   CO2 28 05/12/2013   TSH 2.72 06/16/2011   INR 0.93 05/11/2013   HGBA1C 6.3* 05/12/2013   Coronary angiography:  Coronary dominance: right  Left mainstem: Patent with mild diffuse irregularity. There is mild pressure dampening  and diffuse 20-30% stenosis.  Left anterior descending (LAD): Patent to the LV apex, normal caliber vessel, no obstructive disease. There is mild 20% stenosis in the proximal LAD  Left circumflex (LCx): Medium-caliber vessel. No obstructive disease.  Right coronary artery (RCA): Patent stents throughout without significant ISR. There are areas of minor restenosis but no more than 20-30%. The PDA is stented and there is very tight 95% stenosis in the mid-portion of the stent.  Left ventriculography: not done, pressures recorded only  PCI Note: Following the diagnostic procedure, the decision was made to proceed with PCI. Additional heparin was given for anticoagulation. Once a therapeutic ACT was achieved, a 6 [place] JR-4 guide catheter was inserted. A BMW coronary guidewire was used to cross the lesion. The lesion was predilated with a 2.5x12 balloon. The lesion was then stented with a 2.5x16 mm Promus Premier drug-eluting stent. The stent was postdilated with a 2.75 noncompliant balloon to 24 atm. Following PCI, there was 10% residual stenosis and TIMI-3 flow. Final angiography confirmed an excellent result. The patient tolerated the procedure well. There were no immediate procedural complications. A TR band was used for radial hemostasis. The patient was transferred to the post catheterization recovery area for further monitoring.  PCI Data:  Vessel - PDA/Segment - proximal (in-stent)  Percent Stenosis (pre) 95  TIMI-flow 3  Stent 2.5x16 Promus DES  Percent Stenosis (post) 10  TIMI-flow (post) 3  Final Conclusions:  1. Severe single vessel CAD with successful PCI of the right PDA using a drug-eluting stent  2. Mild nonobstructive stenosis of the left main, LAD, and LCx  Recommendations:  Continue long-term DAPT with ASA and plavix.  Tonny Bollman  05/11/2013, 1:27 PM   Assessment / Plan: 1. CAD - recent PCI - not sure what to make of his 4 to 5 days of atypical chest pain - EKG looks  unchanged - will recheck his labs and include troponin. I have left him on his current regimen for now. See Dr. Excell Seltzer back in 2 months. I have asked him to let us know if he has recurrent symptoms.  2. HLD - he is on statin and Niaspan. Will defer continuation of his Niaspan to Dr. Excell Seltzer.   Patient is agreeable to this plan and will call if any problems develop in the interim.   Rosalio Macadamia, RN, ANP-C [facility] [address] [place], [place]  [address]

## 2013-08-17 ENCOUNTER — Ambulatory Visit (INDEPENDENT_AMBULATORY_CARE_PROVIDER_SITE_OTHER): Payer: Medicare Other | Admitting: Cardiovascular Disease

## 2013-08-17 ENCOUNTER — Encounter: Payer: Self-pay | Admitting: Cardiovascular Disease

## 2013-08-17 VITALS — BP 110/80 | HR 68 | Ht 72.0 in | Wt 162.0 lb

## 2013-08-17 DIAGNOSIS — I2510 Atherosclerotic heart disease of native coronary artery without angina pectoris: Secondary | ICD-10-CM

## 2013-08-17 NOTE — Patient Instructions (Signed)
Your physician recommends that you return for a FASTING LIPID and LIVER in August 2015--nothing to eat or drink after midnight, lab opens at 7:30 AM  Your physician wants you to follow-up in: 1 YEAR with Dr Excell Seltzer.  You will receive a reminder letter in the mail two months in advance. If you don't receive a letter, please call our office to schedule the follow-up appointment.  Your physician recommends that you continue on your current medications as directed. Please refer to the Current Medication list given to you today.

## 2013-08-17 NOTE — Progress Notes (Signed)
    HPI:  67 year old gentleman presenting for followup evaluation. He has coronary artery disease with history of inferior wall myocardial infarction in 2009 secondary to stent thrombosis. He was seen in August 2014 with progressive exertional dyspnea. A Myoview scan showed inferior wall infarction with peri-infarct ischemia. He was referred for cardiac catheterization which demonstrated severe in-stent restenosis in the right PDA branch. He was treated with repeat stenting of result.  He presents today for followup. He is doing fairly well. He continues to have some exertional dyspnea. He's had no chest pain or pressure. He has no dyspnea with normal activities. He denies edema, palpitations, or lightheadedness.  Outpatient Encounter Prescriptions as of 08/17/2013  Medication Sig  . aspirin 325 MG tablet Take 325 mg by mouth daily.    . clopidogrel (PLAVIX) 75 MG tablet Take 75 mg by mouth daily.  . niacin (NIASPAN) 750 MG CR tablet Take 1,500 mg by mouth at bedtime.  . nitroGLYCERIN (NITROSTAT) 0.4 MG SL tablet Place 1 tablet (0.4 mg total) under the tongue every 5 (five) minutes as needed for chest pain.  . Omega-3 Fatty Acids (OMEGA-3 FISH OIL PO) Take 1,000-1,200 mg by mouth daily.   . simvastatin (ZOCOR) 40 MG tablet Take 20 mg by mouth daily.     No Known Allergies  Past Medical History  Diagnosis Date  . Myocardial infarct 09-11-06  . Coronary artery disease     s/p DES to the right PD in September 2014  . Hyperlipidemia     ROS: Negative except as per HPI  BP 110/80  Pulse 68  Ht 6' (1.829 m)  Wt 162 lb (73.483 kg)  BMI 21.97 kg/m2  PHYSICAL EXAM: Pt is alert and oriented, NAD HEENT: normal Neck: JVP - normal, carotids 2+= without bruits Lungs: CTA bilaterally CV: RRR without murmur or gallop Abd: soft, NT, Positive BS, no hepatomegaly Ext: no C/C/E, distal pulses intact and equal Skin: warm/dry no rash  ASSESSMENT AND PLAN: 1. Coronary artery disease, native  vessel. The patient is stable without symptoms of angina. We discussed reinitiation of efforts at exercise program. He will begin walking again. His medical therapy will be continued.  2. Hyperlipidemia. He will be due for repeat lipids in August 2015. He will continue on the combination of niacin and simvastatin. We discussed the questionable benefit of additive niacin, but he wishes to continue for now. I think this is reasonable. Will followup after his lab work is completed next number.  For followup I will see him back in one year unless problems arise. No medication changes were made today.  Tonny Bollman 08/17/2013 11:21 AM

## 2013-11-27 ENCOUNTER — Telehealth: Payer: Self-pay

## 2013-11-28 NOTE — Telephone Encounter (Signed)
I would like him to stay on it as long as cost is not a burden. He has tolerated this well in the past.

## 2013-11-29 ENCOUNTER — Other Ambulatory Visit: Payer: Self-pay

## 2013-11-29 MED ORDER — NIACIN ER (ANTIHYPERLIPIDEMIC) 750 MG PO TBCR: 1500.0000 mg | EXTENDED_RELEASE_TABLET | Freq: Every day | ORAL | Status: DC

## 2014-03-07 ENCOUNTER — Telehealth: Payer: Self-pay | Admitting: Cardiovascular Disease

## 2014-03-07 NOTE — Telephone Encounter (Signed)
New Problem:       Starting 7/2 pt has been feeling like a squeezing in the heart area and some pressure in the neck across the left shoulder and down left arm a 5 pain scale.  Same thing today a 5.

## 2014-03-07 NOTE — Telephone Encounter (Signed)
Discussed with dr cooper, explained to pt the best thing would be for him to go to the ER for eval. Patient voiced understanding but states he may not go. Advised pt that would be the safest thing at this point.

## 2014-03-07 NOTE — Telephone Encounter (Signed)
Spoke with pt,  starting 03-01-14 he is having tightness in his chest. This occurs at rest. He reports he will take an asa and the tightness goes away. This is exactly what he felt prior to the re stenting he had done in sept 2014. He is pain free at present.  Will discuss with dr cooper and call the pt back.

## 2014-03-08 ENCOUNTER — Emergency Department (HOSPITAL_COMMUNITY): Payer: Medicare Other

## 2014-03-08 ENCOUNTER — Other Ambulatory Visit: Payer: Self-pay | Admitting: Physician Assistant

## 2014-03-08 ENCOUNTER — Emergency Department (HOSPITAL_COMMUNITY)
Admission: EM | Admit: 2014-03-08 | Discharge: 2014-03-08 | Disposition: A | Discharge status: Home / Self Care | Payer: Medicare Other | Attending: Emergency Medicine | Admitting: Emergency Medicine

## 2014-03-08 ENCOUNTER — Encounter (HOSPITAL_COMMUNITY): Payer: Self-pay | Admitting: Emergency Medicine

## 2014-03-08 DIAGNOSIS — R072 Precordial pain: Secondary | ICD-10-CM

## 2014-03-08 DIAGNOSIS — Z7982 Long term (current) use of aspirin: Secondary | ICD-10-CM | POA: Insufficient documentation

## 2014-03-08 DIAGNOSIS — G8929 Other chronic pain: Secondary | ICD-10-CM | POA: Insufficient documentation

## 2014-03-08 DIAGNOSIS — I25119 Atherosclerotic heart disease of native coronary artery with unspecified angina pectoris: Secondary | ICD-10-CM

## 2014-03-08 DIAGNOSIS — I252 Old myocardial infarction: Secondary | ICD-10-CM | POA: Insufficient documentation

## 2014-03-08 DIAGNOSIS — I2510 Atherosclerotic heart disease of native coronary artery without angina pectoris: Secondary | ICD-10-CM

## 2014-03-08 DIAGNOSIS — I209 Angina pectoris, unspecified: Secondary | ICD-10-CM

## 2014-03-08 DIAGNOSIS — Z7901 Long term (current) use of anticoagulants: Secondary | ICD-10-CM | POA: Insufficient documentation

## 2014-03-08 DIAGNOSIS — Z79899 Other long term (current) drug therapy: Secondary | ICD-10-CM | POA: Insufficient documentation

## 2014-03-08 DIAGNOSIS — E785 Hyperlipidemia, unspecified: Secondary | ICD-10-CM | POA: Insufficient documentation

## 2014-03-08 DIAGNOSIS — Z87891 Personal history of nicotine dependence: Secondary | ICD-10-CM | POA: Insufficient documentation

## 2014-03-08 HISTORY — DX: Other chronic pain: G89.29

## 2014-03-08 HISTORY — DX: Cervicalgia: M54.2

## 2014-03-08 LAB — BASIC METABOLIC PANEL
Anion gap: 12 (ref 5–15)
BUN: 16 mg/dL (ref 6–23)
CALCIUM: 9.6 mg/dL (ref 8.4–10.5)
CO2: 26 meq/L (ref 19–32)
Chloride: 102 mEq/L (ref 96–112)
Creatinine, Ser: 0.89 mg/dL (ref 0.50–1.35)
GFR calc Af Amer: >90 mL/min (ref >90)
GFR calc non Af Amer: 87 mL/min — ABNORMAL LOW (ref >90)
GLUCOSE: 118 mg/dL — AB (ref 70–99)
POTASSIUM: 4.2 meq/L (ref 3.7–5.3)
SODIUM: 140 meq/L (ref 137–147)

## 2014-03-08 LAB — I-STAT TROPONIN, ED: TROPONIN I, POC: 0 ng/mL (ref 0.00–0.08)

## 2014-03-08 LAB — CBC
HEMATOCRIT: 43 % (ref 39.0–52.0)
HEMOGLOBIN: 14.7 g/dL (ref 13.0–17.0)
MCH: 33 pg (ref 26.0–34.0)
MCHC: 34.2 g/dL (ref 30.0–36.0)
MCV: 96.4 fL (ref 78.0–100.0)
Platelets: 149 10*3/uL — ABNORMAL LOW (ref 150–400)
RBC: 4.46 MIL/uL (ref 4.22–5.81)
RDW: 12.8 % (ref 11.5–15.5)
WBC: 5.8 10*3/uL (ref 4.0–10.5)

## 2014-03-08 LAB — PRO B NATRIURETIC PEPTIDE: PRO B NATRI PEPTIDE: 37.5 pg/mL (ref 0–125)

## 2014-03-08 MED ORDER — ASPIRIN 81 MG PO CHEW
324.0000 mg | CHEWABLE_TABLET | Freq: Once | ORAL | Status: AC
  Administered 2014-03-08: 324 mg via ORAL
  Filled 2014-03-08: qty 4

## 2014-03-08 NOTE — ED Notes (Signed)
Patient here with shortness of breath, fatigue, left arm pain, and left sub clavicular pain which started about 2 weeks ago. History of MI and stent placement, but not recently. Patient spoke with PCP and was advised to present to ED for evaluation.

## 2014-03-08 NOTE — Consult Note (Signed)
CARDIOLOGY CONSULT NOTE  Patient ID: [name], MRN: [id number], DOB/AGE: 68/11/1945 68 y.o. Admit date: 03/08/2014 Date of Consult: 03/08/2014  Primary Physician: Joycelyn RuaMEYERS, STEPHEN, MD Primary Cardiologist: Excell Seltzerooper Referring Physician: Clarene DukeMcManus  Chief Complaint: Chest pain Reason for Consultation: Chest pain  HPI: This is a 68 year old gentleman well-known to me. He has a long-standing history of coronary artery disease. He has undergone multiple PCI procedures involving the right coronary artery. His last cardiac catheterization in September 2014 demonstrated continued stent patency in the mid right coronary artery, but there was severe stenosis in the right PDA. A drug-eluting stent was used to treat an area of severe in-stent restenosis. The patient had no other significant obstructive disease noted. He has done well until 2-3 weeks ago where he has experienced episodes of chest pressure in the left chest and left arm. These have only occurred at rest. They have lasted for approximately 1 minute. They have felt similar to his cardiac pain in the past, although there has not been an exertional component this time. He has not required nitroglycerin. There is no associated shortness of breath, lightheadedness, palpitations, or syncope. He has been limited by hip and knee pain, and has not been doing any walking for the last several months. He is not engaged in any formal exercise. He has been compliant with his medications, including dual antiplatelet therapy with aspirin and Plavix. He reports no bleeding problems.  Past Medical History  Diagnosis Date  . Myocardial infarct 09-11-06, 2009  . Coronary artery disease     s/p DES to the right PD in September 2014  . Hyperlipidemia   . Chronic neck pain       Surgical History:  Past Surgical History  Procedure Laterality Date  . Cardiac catheterization  10-04-08  . Coronary angioplasty with stent placement  05/2013  . Cervical spine surgery       Home Meds: Prior to Admission medications   Medication Sig Start Date End Date Taking? Authorizing Provider  aspirin 325 MG tablet Take 325 mg by mouth daily.     Yes Historical Provider, MD  clopidogrel (PLAVIX) 75 MG tablet Take 75 mg by mouth daily.   Yes Historical Provider, MD  niacin (NIASPAN) 750 MG CR tablet Take 2 tablets (1,500 mg total) by mouth at bedtime. 11/29/13  Yes Jack BollmanMichael Dashonda Bonneau, MD  Omega-3 Fatty Acids (OMEGA-3 FISH OIL PO) Take 1,000-1,200 mg by mouth daily.    Yes Historical Provider, MD  simvastatin (ZOCOR) 40 MG tablet Take 20 mg by mouth daily.  03/14/13  Yes Jack BollmanMichael Jowan Skillin, MD  nitroGLYCERIN (NITROSTAT) 0.4 MG SL tablet Place 1 tablet (0.4 mg total) under the tongue every 5 (five) minutes as needed for chest pain. 05/12/13   Jack Jumphonda G Barrett, PA-C    Inpatient Medications:     Allergies: No Known Allergies  History   Social History  . Marital Status: Married    Spouse Name: N/A    Number of Children: 1  . Years of Education: N/A   Occupational History  . retired Administrator, artsscience teacher    Social History Main Topics  . Smoking status: Former Games developermoker  . Smokeless tobacco: Not on file  . Alcohol Use: No  . Drug Use: No  . Sexual Activity: Not Currently   Other Topics Concern  . Not on file   Social History Narrative  . No narrative on file     Family History  Problem Relation Age of Onset  .  Heart attack Father 34    died  . Coronary artery disease      siblings     Review of Systems: General: negative for chills, fever, night sweats or weight changes. Positive for generalized fatigue ENT: negative for rhinorrhea or epistaxis Cardiovascular: See history of present illness Dermatological: negative for rash Respiratory: negative for cough or wheezing GI: negative for nausea, vomiting, diarrhea, bright red blood per rectum, melena, or hematemesis GU: no hematuria, urgency, or frequency Neurologic: negative for visual changes, syncope, headache, or  dizziness Heme: no easy bruising or bleeding Endo: negative for excessive thirst, thyroid disorder, or flushing Musculoskeletal: Positive for hip and knee pain  All other systems reviewed and are otherwise negative except as noted above.  Physical Exam: Blood pressure 115/62, pulse 72, temperature 97.9 F (36.6 C), temperature source Oral, resp. rate 14, height 6' (1.829 m), weight 74.844 kg (165 lb), SpO2 97.00%. General: Well developed, well nourished, alert and oriented, in no acute distress. HEENT: Normocephalic, atraumatic, sclera non-icteric, no xanthomas, nares are without discharge.  Neck: Supple. Carotids 2+ without bruits. JVP normal Lungs: Clear bilaterally to auscultation without wheezes, rales, or rhonchi. Breathing is unlabored. Heart: RRR with normal S1 and S2. No murmurs, rubs, or gallops appreciated. Abdomen: Soft, non-tender, non-distended with normoactive bowel sounds. No hepatomegaly. No rebound/guarding. No obvious abdominal masses. Back: No CVA tenderness Msk:  Strength and tone appear normal for age. Extremities: No clubbing, cyanosis, or edema.  Distal pedal pulses are 2+ and equal bilaterally. Neuro: CNII-XII intact, moves all extremities spontaneously. Strength intact and equal bilaterally Psych:  Responds to questions appropriately with a normal affect.    Labs: No results found for this basename: CKTOTAL, CKMB, TROPONINI,  in the last 72 hours Lab Results  Component Value Date   WBC 5.8 03/08/2014   HGB 14.7 03/08/2014   HCT 43.0 03/08/2014   MCV 96.4 03/08/2014   PLT 149* 03/08/2014    Recent Labs Lab 03/08/14 0723  NA 140  K 4.2  CL 102  CO2 26  BUN 16  CREATININE 0.89  CALCIUM 9.6  GLUCOSE 118*   Lab Results  Component Value Date   CHOL 120 04/21/2013   HDL 40.40 04/21/2013   LDLCALC 63 04/21/2013   TRIG 82.0 04/21/2013   No results found for this basename: DDIMER    Radiology/Studies:  Dg Chest 2 View  03/08/2014   CLINICAL DATA:  Shortness of  breath.  EXAM: CHEST  2 VIEW  COMPARISON:  Single view of the chest 01/29/2010 and PA and lateral chest 04/01/2007.  FINDINGS: The lungs are clear. Heart size is normal. No pneumothorax or pleural effusion.  IMPRESSION: No acute disease.   Electronically Signed   By: Drusilla Kanner M.D.   On: 03/08/2014 07:40    EKG: Normal sinus rhythm and nonspecific T wave abnormality  Cardiac Studies: Chest x-ray: FINDINGS:  The lungs are clear. Heart size is normal. No pneumothorax or  pleural effusion.  IMPRESSION:  No acute disease.   ASSESSMENT AND PLAN:  1. Chest pain, typical and atypical features 2. Known coronary artery disease with anginal symptoms 3. Hyperlipidemia, treated with a long-term statin drug  The patient's symptoms are self-limited and last only about 1 minute. There is not an accelerating pattern as he reports stable intermittent symptoms over the past few weeks. His cardiac markers are normal and his EKG is unremarkable. I think an outpatient stress test is appropriate. I do not think he requires hospital admission.  Will arrange a Lexiscan Myoview within the next few days and he will continue on his current medical program. He understands if there is any change in symptoms or prolonged episodes of chest pain lasting greater than 5 minutes that he needs to seek immediate medical attention.  Signed, Jack Bollman, MD 03/08/2014, 10:48 AM

## 2014-03-08 NOTE — Progress Notes (Signed)
Lexiscan cardiolite scheduled for Monday at 10am. Per scheduler, pt should arrive at radiology at 9:45am. No caffeine 24 hrs prior to test. NPO after midnight the night before, except sips of water with meds. I relayed this information to the patient. [facility] also [facility] for test and she will work on this. Dayna Dunn PA-C

## 2014-03-08 NOTE — ED Provider Notes (Signed)
CSN: [id number]     Arrival date & time 03/08/14  [id number] History   First MD Initiated Contact with Patient 03/08/14 [phone]     Chief Complaint  Patient presents with  . Shortness of Breath  . Arm Pain  . Fatigue     HPI Pt was seen at 0710. Per pt, c/o gradual onset and persistence of multiple intermittent episodes of chest "pain" for the past 1 to 2 weeks. Pt describes the pain as "squeezing," located in his left upper chest, with radiation of "pressure feeling" into his left neck and arm. Has been associated with progressive SOB and generalized fatigue. Symptoms have been occurring at rest, last for "about a minute" before spontaneously resolving. Pt also describes his symptoms as "like the last time I needed a stent." Denies any change in his symptoms over the past 1 to 2 weeks. Has not taken ntg for his symptoms. Pt called his Cards MD office yesterday and was told to go to the ED for evaluation. Pt's wife states she "nagged him" and that is why he "finally came to the ED today." Denies palpitations, no cough, no fevers, no back pain, no abd pain, no N/V/D.    Past Medical History  Diagnosis Date  . Myocardial infarct 09-11-06, 2009  . Coronary artery disease     s/p DES to the right PD in September 2014  . Hyperlipidemia   . Chronic neck pain    Past Surgical History  Procedure Laterality Date  . Cardiac catheterization  10-04-08  . Coronary angioplasty with stent placement  05/2013  . Cervical spine surgery     Family History  Problem Relation Age of Onset  . Heart attack Father 1763    died  . Coronary artery disease      siblings   History  Substance Use Topics  . Smoking status: Former Games developermoker  . Smokeless tobacco: Not on file  . Alcohol Use: No    Review of Systems ROS: Statement: All systems negative except as marked or noted in the HPI; Constitutional: Negative for fever and chills. ; ; Eyes: Negative for eye pain, redness and discharge. ; ; ENMT: Negative for ear pain,  hoarseness, nasal congestion, sinus pressure and sore throat. ; ; Cardiovascular: +CP. Negative for palpitations, diaphoresis, dyspnea and peripheral edema. ; ; Respiratory: Negative for cough, wheezing and stridor. ; ; Gastrointestinal: Negative for nausea, vomiting, diarrhea, abdominal pain, blood in stool, hematemesis, jaundice and rectal bleeding. . ; ; Genitourinary: Negative for dysuria, flank pain and hematuria. ; ; Musculoskeletal: Negative for back pain and neck pain. Negative for swelling and trauma.; ; Skin: Negative for pruritus, rash, abrasions, blisters, bruising and skin lesion.; ; Neuro: Negative for headache, lightheadedness and neck stiffness. Negative for weakness, altered level of consciousness , altered mental status, extremity weakness, paresthesias, involuntary movement, seizure and syncope.      Allergies  Review of patient's allergies indicates no known allergies.  Home Medications   Prior to Admission medications   Medication Sig Start Date End Date Taking? Authorizing Provider  aspirin 325 MG tablet Take 325 mg by mouth daily.     Yes Historical Provider, MD  clopidogrel (PLAVIX) 75 MG tablet Take 75 mg by mouth daily.   Yes Historical Provider, MD  niacin (NIASPAN) 750 MG CR tablet Take 2 tablets (1,500 mg total) by mouth at bedtime. 11/29/13  Yes Tonny BollmanMichael Cooper, MD  Omega-3 Fatty Acids (OMEGA-3 FISH OIL PO) Take 1,000-1,200 mg by mouth daily.  Yes Historical Provider, MD  simvastatin (ZOCOR) 40 MG tablet Take 20 mg by mouth daily.  03/14/13  Yes Tonny Bollman, MD  nitroGLYCERIN (NITROSTAT) 0.4 MG SL tablet Place 1 tablet (0.4 mg total) under the tongue every 5 (five) minutes as needed for chest pain. 05/12/13   Rhonda G Barrett, PA-C   BP 115/67  Pulse 72  Temp(Src) 97.7 F (36.5 C) (Oral)  Resp 20  Ht 6' (1.829 m)  Wt 165 lb (74.844 kg)  BMI 22.37 kg/m2  SpO2 99% Physical Exam 0715: Physical examination:  Nursing notes reviewed; Vital signs and O2 SAT  reviewed;  Constitutional: Well developed, Well nourished, Well hydrated, In no acute distress; Head:  Normocephalic, atraumatic; Eyes: EOMI, PERRL, No scleral icterus; ENMT: Mouth and pharynx normal, Mucous membranes moist; Neck: Supple, Full range of motion, No lymphadenopathy; Cardiovascular: Regular rate and rhythm, No gallop; Respiratory: Breath sounds clear & equal bilaterally, No rales, rhonchi, wheezes.  Speaking full sentences with ease, Normal respiratory effort/excursion; Chest: Nontender, Movement normal; Abdomen: Soft, Nontender, Nondistended, Normal bowel sounds; Genitourinary: No CVA tenderness; Extremities: Pulses normal, No tenderness, No edema, No calf edema or asymmetry.; Neuro: AA&Ox3, Major CN grossly intact.  Speech clear. No gross focal motor or sensory deficits in extremities.; Skin: Color normal, Warm, Dry.   ED Course  Procedures     EKG Interpretation   Date/Time:  Thursday March 08 2014 06:41:32 EDT Ventricular Rate:  71 PR Interval:  150 QRS Duration: 80 QT Interval:  392 QTC Calculation: 425 R Axis:   89 Text Interpretation:  Sinus rhythm with Premature atrial complexes  Artifact ST abnormality, possible digitalis effect Abnormal ECG When  compared with ECG of 05/12/2013 No significant change was found Confirmed  by [facility]  MD, Nicholos Johns [phone]) on 03/08/2014 7:23:49 AM      MDM  MDM Reviewed: previous chart and nursing note Reviewed previous: labs and ECG Interpretation: labs, ECG and x-ray     Results for orders placed during the hospital encounter of 03/08/14  CBC      Result Value Ref Range   WBC 5.8  4.0 - 10.5 K/uL   RBC 4.46  4.22 - 5.81 MIL/uL   Hemoglobin 14.7  13.0 - 17.0 g/dL   HCT 60.4  54.0 - 98.1 %   MCV 96.4  78.0 - 100.0 fL   MCH 33.0  26.0 - 34.0 pg   MCHC 34.2  30.0 - 36.0 g/dL   RDW 19.1  47.8 - 29.5 %   Platelets 149 (*) 150 - 400 K/uL  BASIC METABOLIC PANEL      Result Value Ref Range   Sodium 140  137 - 147 mEq/L    Potassium 4.2  3.7 - 5.3 mEq/L   Chloride 102  96 - 112 mEq/L   CO2 26  19 - 32 mEq/L   Glucose, Bld 118 (*) 70 - 99 mg/dL   BUN 16  6 - 23 mg/dL   Creatinine, Ser 6.21  0.50 - 1.35 mg/dL   Calcium 9.6  8.4 - 30.8 mg/dL   GFR calc non Af Amer 87 (*) >90 mL/min   GFR calc Af Amer >90  >90 mL/min   Anion gap 12  5 - 15  PRO B NATRIURETIC PEPTIDE      Result Value Ref Range   Pro B Natriuretic peptide (BNP) 37.5  0 - 125 pg/mL  I-STAT TROPOININ, ED      Result Value Ref Range   Troponin i,  poc 0.00  0.00 - 0.08 ng/mL   Comment 3            Dg Chest 2 View 03/08/2014   CLINICAL DATA:  Shortness of breath.  EXAM: CHEST  2 VIEW  COMPARISON:  Single view of the chest 01/29/2010 and PA and lateral chest 04/01/2007.  FINDINGS: The lungs are clear. Heart size is normal. No pneumothorax or pleural effusion.  IMPRESSION: No acute disease.   Electronically Signed   By: Drusilla Kanner M.D.   On: 03/08/2014 07:40    0815:  No symptoms while in the ED.  T/C to Cardiology, case discussed, including:  HPI, pertinent PM/SHx, VS/PE, dx testing, ED course and treatment:  Agreeable to come to the ED for evaluation.   1130:  Cards Dr. Excell Seltzer has evaluated pt and consult has been written: pt will be d/c home today and Cards will schedule an outpatient stress test. Pt repeated his d/w Cards MD as above, and wants to go home now. Continues to deny symptoms while in the ED. Will d/c home with outpt f/u.     Laray Anger, DO 03/10/14 1430

## 2014-03-08 NOTE — Discharge Instructions (Signed)
°Emergency Department Resource Guide °1) Find a Doctor and Pay Out of Pocket °Although you won't have to find out who is covered by your insurance plan, it is a good idea to ask around and get recommendations. You will then need to call the office and see if the doctor you have chosen will accept you as a new patient and what types of options they offer for patients who are self-pay. Some doctors offer discounts or will set up payment plans for their patients who do not have insurance, but you will need to ask so you aren't surprised when you get to your appointment. ° °2) Contact Your Local Health Department °Not all health departments have doctors that can see patients for sick visits, but many do, so it is worth a call to see if yours does. If you don't know where your local health department is, you can check in your phone book. The CDC also has a tool to help you locate your state's health department, and many state websites also have listings of all of their local health departments. ° °3) Find a Walk-in Clinic °If your illness is not likely to be very severe or complicated, you may want to try a walk in clinic. These are popping up all over the country in pharmacies, drugstores, and shopping centers. They're usually staffed by nurse practitioners or physician assistants that have been trained to treat common illnesses and complaints. They're usually fairly quick and inexpensive. However, if you have serious medical issues or chronic medical problems, these are probably not your best option. ° °No Primary Care Doctor: °- Call Health Connect at  832-8000 - they can help you locate a primary care doctor that  accepts your insurance, provides certain services, etc. °- Physician Referral Service- 1-800-533-3463 ° °Chronic Pain Problems: °Organization         Address  Phone   Notes  °[facility]  (336) [phone] Patients need to be referred by their primary care doctor.  ° °Medication  Assistance: °Organization         Address  Phone   Notes  °[place] [facility] [address] °[place], [place] [address] (336) 641-8030 --Must be a resident of [place] °-- Must have NO insurance coverage whatsoever (no Medicaid/ Medicare, etc.) °-- The pt. MUST have a primary care doctor that directs their care regularly and follows them in the community °  °[facility]  (866) 331-1348   °[employer]  (888) 892-1162   ° °Agencies that provide inexpensive medical care: °Organization         Address  Phone   Notes  °[facility]  (336) 832-8035   °[facility]    (336) 832-7272   °[facility] [address] °[place], [place] [address] (336) 832-4777   °[facility] [address], °[place] (336) 271-4999   °[facility]    (336) 373-0678   °[facility]    (336) 272-1050   °[facility] ° [address], [place] Phone:  (336) 832-4444, Fax:  (336) 832-4440 Hours of Operation:  9 am - 6 pm, M-F.  Also accepts Medicaid/Medicare and self-pay.  °[facility] ° [address], [place] Phone: (336) 832-3150, Fax: (336) [phone]. Hours of Operation:  8:30 am - 5:30 pm, M-F.  Also accepts Medicaid and self-pay.  °[facility] [place] [address], [place] Phone: (336) 878-6027   °[facility] [address], [place], [place] (336)723-1848, Ext. 123 Mondays & Thursdays: 7-9 AM.  First 15 patients are seen on a first come, first serve basis. °  ° °Medicaid-accepting [place] Providers: ° °Organization         Address  Phone   Notes  °[facility] [address], [place] (336) 641-2100 Also accepts self-pay patients.  °[facility] [address], [place] ° (336) 856-9996   °[facility] [address], [place]  (336) 288-8857   °[facility] [address] [place] [address], [place] (336) 299-7000   °Veita Bland [address], [place]  ° (336) 373-1557 Only accepts [place] Access Medicaid patients after they have their name applied to their card.  ° °Self-Pay (no insurance) in [place]: ° °Organization         Address  Phone   Notes  °Sickle Cell Patients, [facility] [address], [place] (336) 832-1970   °[facility] [address], [place] (336) 832-4400   °[facility] ° [address], [place] (336) 992-4800   °[facility]/Dr. Osei-Bonsu ° [address] [place] [address], [place] or [address], [place] (336) 841-8500 Phone number for both [place] and [place] locations is the same.  °[facility] [address], [place] (336) 299-0000   °[facility] [address] [place] [address], [place] or [address] (336) 852-7530 °(336) 878-2260   °[facility] [address], [place] (336) 350-1642, phone; (336) 294-5005, fax Sees patients 1st and 3rd Saturday of every month.  Must not qualify for public or private insurance (i.e. Medicaid, Medicare, [place] Health Choice, Veterans' Benefits) • Household income should be no more than 200% of the poverty level •The clinic cannot treat you if you are pregnant or think you are pregnant • Sexually transmitted diseases are not treated at the clinic.  ° ° °Dental Care: °Organization         Address  Phone  Notes  °[place] [facility] [address], [place] (336) 641-6152 Accepts children up to age 21 who are enrolled in Medicaid or [place] Health Choice; pregnant women with a Medicaid card; and children who have applied for Medicaid or [place] Health Choice, but were declined, whose parents can pay a reduced fee at time of service.  °[place]  [facility] [place]  [address], [place] (336) 641-7733 Accepts children up to age 21 who are enrolled in Medicaid or [place] Health Choice; pregnant women with a Medicaid card; and children who have applied for Medicaid or [place] Health Choice, but were declined, whose parents can pay a reduced fee at time of service.  °[facility] ° [address], [place] (336) 641-4533 Patients are seen by appointment only. Walk-ins are not accepted. [facility] will see patients 18 years of age and older. °Monday - Tuesday (8am-5pm) °Most Wednesdays (8:30-5pm) °$30 per visit, cash only  °[facility] ° [address], [place] (336) 641-4533 Patients are seen by appointment only. Walk-ins are not accepted. [facility] will see patients 18 years of age and older. °One   Wednesday Evening (Monthly: Volunteer Based).  $30 per visit, cash only  °[facility]  (919) 537-3737 for adults; Children under age 4, call Graduate Pediatric Dentistry at ([phone]) [phone]. Children aged 4-14, please call ([phone]) [phone] to request a pediatric application. ° Dental services are provided in all areas of dental care including fillings, crowns and bridges, complete and partial dentures, implants, gum treatment, root canals, and extractions. Preventive care is also provided. Treatment is provided to both adults and children. °Patients are selected via a lottery and there is often a waiting list. °  °[facility] [address], °[place] ° (336) 763-8833 [email or web address] °  °[facility] [address], [place], [place] (336)723-1848, Ext. 123 Second and Fourth Thursday of each month, opens at 6:30 AM; Clinic ends at 9 AM.  Patients are seen on a first-come first-served basis, and a limited number are seen during each clinic.  ° °[facility] ° [address], [place], [place] (336) 723-7904    Eligibility Requirements °You must have lived in [place], [place], or [place] counties for at least the last three months. °  You cannot be eligible for state or federal sponsored healthcare insurance, including Veterans Administration, Medicaid, or Medicare. °  You generally cannot be eligible for healthcare insurance through your employer.  °  How to apply: °Eligibility screenings are held every Tuesday and Wednesday afternoon from 1:00 pm until 4:00 pm. You do not need an appointment for the interview!  °[facility] [address], [place], [place] 336-631-2330   °[facility]  336-342-8273   °[facility]  336-703-3100   °[facility]  336-570-6415   ° °Behavioral Health Resources in the Community: °Intensive Outpatient Programs °Organization         Address  Phone  Notes  °[facility] [address], [place], [place] 336-878-6098   °[facility] [address], [place], [place] 336-832-9800   °[facility] [address], [place], [place] ° 336-882-2125   °[facility] [address],  °[place], [place] 1-800-853-5163 or 336-641-4981   °Substance Abuse Resources °Organization         Address  Phone  Notes  °[facility]  336-882-2125   °[facility]  336-784-9470   °[facility]  336-285-9073   °[facility]  336-845-3988   °Residential & Outpatient Substance Abuse Program  1-800-659-3381   °Psychological Services °Organization         Address  Phone  Notes  °[facility]  336- 832-9600   °[facility]  336- 378-7881   °[facility] [address], [place] 1-800-853-5163 or 336-641-4981   ° °Mobile Crisis Teams °Organization         Address  Phone  Notes  °[facility], Mobile Crisis Care Unit  1-877-626-1772   °Assertive °[facility] ° [address]  [place], [place] 336-834-9664   °Sharon DeEsch [address] °[place] 336-554-5454   ° °Self-Help/Support Groups °Organization         Address  Phone             Notes  °[facility] of  - variety of support groups  336- 373-1402 Call for more information  °Narcotics Anonymous (NA), [facility] [address], °[place]  2 meetings at this location  ° °  Residential Treatment Programs Organization         Address  Phone  Notes  [facility] [address],    [place]  1-610-960-45401-(305)[phone]   [facility]  [address], [place] [phone], [place], [place] [phone]   [facility] [address], [place] [phone] Admissions: 8am-3pm M-F  [facility] [address]    [place], [place] [phone]   [facility] [address], [place], [place] [phone]   [facility] [address]  [place], [place] [phone]   [facility] Intensive Outpatient [address], [place], [place] [phone]   [facility] ([facility] [address]  [place], [place] [phone] or [phone]   [facility] [address] [place], [place] [phone] Accepts Medicaid  [facility] [address]  [place] [phone] Substance Abuse/Addiction Treatment   [facility] Resources Organization         Address  Phone  Notes  [facility]  (787) 237-1059(888) [phone]   Angie FavaJulie Brannon, PhD [address], [address] [place], [place]   [phone](336) [phone] or [phone](336) [phone]   [facility]   [address] [place], [place] ([phone](336) [phone]   [facility] [address], [place], [place] 218-626-2656(336) [phone] Insurance/Medicaid/sponsorship through [facility] [address]                                    [place], [place] [phone](336) [phone] Therapy/tele-psych/case    [facility] [address]   [place], [place] [phone](336) [phone]    Dr. Lolly MustacheArfeen  [phone](336) [phone]   [facility]  [facility] 1) [address], [place] 2) [address], [place] 3)  [address], [place] [phone](336) [phone](336) ([phone]  [phone](336) [phone]   [facility] Child Abuse Hotline 6100905316(336) [phone] or (731)[phone](336) [phone] (After Hours)       Take your usual prescriptions as previously directed.  Call your regular Cardiologist today to schedule a follow up appointment within the next 2 days for a stress test, as discussed.  Return to the Emergency Department immediately sooner if worsening.     ----     You have a Stress Test scheduled. Your doctor has ordered this test to get a better idea of how your heart works.  Instructions:  No food/drink after midnight the night before.   No caffeine/decaf products 24 hours before, including medicines such as Excedrin or Goody Powders. Call if there are any questions.   Wear comfortable clothes and shoes.   It is OK to take your morning meds with a sip of water EXCEPT for those types of medicines listed below or otherwise instructed.  Special Medication Instructions:  Beta blockers such as metoprolol (Lopressor/Toprol XL), atenolol (Tenormin), carvedilol (Coreg), nebivolol (Bystolic), propranolol (Inderal) should not be taken for 24 hours before the test.  Calcium channel blockers such as diltiazem (Cardizem) or verapmil (Calan) should not be taken for 24 hours before the test.  Remove nitroglycerin patches and do not take nitrate preparations such as Imdur/isosorbide the day of your test.  No Persantine/Theophylline or Aggrenox medicines should be used within 24 hours of the test.   What To Expect: The whole test will take several  hours. When you arrive in the lab, the technician will inject a small amount of radioactive tracer into your arm through an IV while you are resting  quietly. This helps [place] to form pictures of your heart. You will likely only feel a sting from the IV. After a waiting period, resting pictures will be obtained under a big camera. These are the "before" pictures.  Next, you will be prepped for the stress portion of the test. This may include either walking on a treadmill or receiving a medicine that helps to dilate blood vessels in your heart to simulate the effect of exercise on your heart. If you are walking on a treadmill, you will walk at different paces to try to get your heart rate to a goal number that is based on your age. If your doctor has chosen the pharmacologic test, then you will receive a medicine through your IV that may cause temporary nausea, flushing, shortness of breath and sometimes chest discomfort or vomiting. This is typically short-lived and usually resolves quickly. Your blood pressure and heart rate will be monitored, and we will be watching your EKG on a computer screen for any changes. During this portion of the test, the radiologist will inject another small amount of radioactive tracer into your IV. After a waiting period, you will undergo a second set of pictures. These are the "after" pictures.  The doctor reading the test will compare the before-and-after images to look for evidence of heart blockages or heart weakness. In certain instances, this test is done over 2 days but usually only takes 1 day to complete.

## 2014-03-12 ENCOUNTER — Encounter (HOSPITAL_COMMUNITY)
Admit: 2014-03-12 | Discharge: 2014-03-12 | Disposition: A | Discharge status: Home / Self Care | Payer: Medicare Other | Attending: Family Medicine | Admitting: Family Medicine

## 2014-03-12 ENCOUNTER — Ambulatory Visit (HOSPITAL_COMMUNITY)
Admit: 2014-03-12 | Discharge: 2014-03-12 | Disposition: A | Discharge status: Home / Self Care | Payer: Medicare Other | Source: Ambulatory Visit | Attending: Family Medicine | Admitting: Family Medicine

## 2014-03-12 DIAGNOSIS — R079 Chest pain, unspecified: Secondary | ICD-10-CM

## 2014-03-12 DIAGNOSIS — R072 Precordial pain: Secondary | ICD-10-CM | POA: Insufficient documentation

## 2014-03-12 MED ORDER — TECHNETIUM TC 99M SESTAMIBI - CARDIOLITE
30.0000 | Freq: Once | INTRAVENOUS | Status: AC | PRN
  Administered 2014-03-12: 11:00:00 30 via INTRAVENOUS

## 2014-03-12 MED ORDER — REGADENOSON 0.4 MG/5ML IV SOLN
0.4000 mg | Freq: Once | INTRAVENOUS | Status: AC
  Administered 2014-03-12: 0.4 mg via INTRAVENOUS

## 2014-03-12 MED ORDER — REGADENOSON 0.4 MG/5ML IV SOLN
INTRAVENOUS | Status: AC
  Filled 2014-03-12: qty 5

## 2014-03-12 MED ORDER — TECHNETIUM TC 99M SESTAMIBI GENERIC - CARDIOLITE
10.0000 | Freq: Once | INTRAVENOUS | Status: AC | PRN
  Administered 2014-03-12: 10 via INTRAVENOUS

## 2014-03-15 ENCOUNTER — Telehealth: Payer: Self-pay | Admitting: Cardiovascular Disease

## 2014-03-15 NOTE — Telephone Encounter (Signed)
New Prob    Pt calling regarding stress test results. Please call.

## 2014-03-15 NOTE — Telephone Encounter (Signed)
Myoview was performed at the hospital. Results are in the pt's chart for Dr Excell Seltzerooper to review.

## 2014-03-16 NOTE — Telephone Encounter (Signed)
Patient is calling and would like results. Please call and advise.

## 2014-03-16 NOTE — Telephone Encounter (Signed)
I spoke with the pt and gave him the preliminary results of myoview.  I made the pt aware that I will contact him again once Dr Excell [facility]eltzerooper [facility].

## 2014-03-16 NOTE — Telephone Encounter (Signed)
Reviewed his study and this fits with his prior infarct. Overall looks low-risk and I'd be inclined to continue with medical management. thx

## 2014-03-19 NOTE — Telephone Encounter (Signed)
Pt aware of final myoview results.

## 2014-04-02 ENCOUNTER — Encounter: Payer: Medicare Other | Admitting: Physician Assistant

## 2014-04-03 ENCOUNTER — Other Ambulatory Visit: Payer: Medicare Other

## 2014-05-09 ENCOUNTER — Other Ambulatory Visit: Payer: Self-pay

## 2014-05-09 MED ORDER — CLOPIDOGREL BISULFATE 75 MG PO TABS: 75.0000 mg | ORAL_TABLET | Freq: Every day | ORAL | Status: DC

## 2014-06-15 ENCOUNTER — Other Ambulatory Visit: Payer: Self-pay

## 2014-08-09 ENCOUNTER — Encounter (HOSPITAL_COMMUNITY): Payer: Self-pay | Admitting: Cardiovascular Disease

## 2014-09-09 ENCOUNTER — Other Ambulatory Visit: Payer: Self-pay | Admitting: Cardiovascular Disease

## 2014-09-17 ENCOUNTER — Telehealth: Payer: Self-pay | Admitting: Cardiovascular Disease

## 2014-09-17 NOTE — Telephone Encounter (Signed)
Pt was told he needs to be seen asap for refills, requesting late pm, pls let me know when he can be seen and I will clal him and make an appt/mt

## 2014-09-18 ENCOUNTER — Ambulatory Visit (INDEPENDENT_AMBULATORY_CARE_PROVIDER_SITE_OTHER): Payer: Medicare PPO | Admitting: Cardiovascular Disease

## 2014-09-18 ENCOUNTER — Encounter: Payer: Self-pay | Admitting: Cardiovascular Disease

## 2014-09-18 VITALS — BP 110/74 | HR 72 | Ht 72.0 in | Wt 168.0 lb

## 2014-09-18 DIAGNOSIS — E7800 Pure hypercholesterolemia, unspecified: Secondary | ICD-10-CM

## 2014-09-18 DIAGNOSIS — I2510 Atherosclerotic heart disease of native coronary artery without angina pectoris: Secondary | ICD-10-CM

## 2014-09-18 MED ORDER — ASPIRIN 81 MG PO TABS: 81.0000 mg | ORAL_TABLET | Freq: Every day | ORAL | Status: AC

## 2014-09-18 MED ORDER — NIACIN ER (ANTIHYPERLIPIDEMIC) 750 MG PO TBCR: 1500.0000 mg | EXTENDED_RELEASE_TABLET | Freq: Every day | ORAL | Status: DC

## 2014-09-18 MED ORDER — SIMVASTATIN 40 MG PO TABS: 20.0000 mg | ORAL_TABLET | Freq: Every day | ORAL | Status: DC

## 2014-09-18 MED ORDER — CLOPIDOGREL BISULFATE 75 MG PO TABS: 75.0000 mg | ORAL_TABLET | Freq: Every day | ORAL | Status: DC

## 2014-09-18 NOTE — Telephone Encounter (Signed)
I spoke with the pt and scheduled him to see Dr Excell Seltzerooper today.

## 2014-09-18 NOTE — Patient Instructions (Signed)
Your physician has recommended you make the following change in your medication:  1. DECREASE Aspirin to 81mg  take one by mouth daily  Your physician recommends that you return for a FASTING LIPID and LIVER profile--nothing to eat or drink after midnight, lab opens at 7:30 AM  Your physician wants you to follow-up in: 1 YEAR with Dr Excell Seltzerooper.  You will receive a reminder letter in the mail two months in advance. If you don't receive a letter, please call our office to schedule the follow-up appointment.

## 2014-09-18 NOTE — Progress Notes (Signed)
Cardiology Office Note   Date:  09/18/2014   ID:  Jack ModeJohn R Brashears, DOB 11/18/1945, MRN [id number]  PCP:  Joycelyn RuaMEYERS, STEPHEN, MD  Cardiologist:  Tonny BollmanMichael Torrance Stockley, MD    No chief complaint on file.    History of Present Illness: [name] is a [age] y.o. male who presents for follow-up of CAD.  He has coronary artery disease with history of inferior wall myocardial infarction in 2009 secondary to stent thrombosis. He was seen in August 2014 with progressive exertional dyspnea. A Myoview scan showed inferior wall infarction with peri-infarct ischemia. He was referred for cardiac catheterization which demonstrated severe in-stent restenosis in the right PDA branch. He was treated with repeat stenting. He presented to the ER in July 2015 with atypical chest pain and a nuclear stress test was completed showing inferior infarct with mild peri-infarct ischemia. Overall interpretation was low-risk.   Since I saw him last summer, he reports no symptoms. He is actually doing quite well. He walked over 70 miles the months of August, September, and October. He has been less active than the cold weather. He denies any exertional symptoms. He specifically denies chest pain, shortness of breath, leg swelling, or heart palpitations.  Past Medical History  Diagnosis Date  . Myocardial infarct 09-11-06, 2009  . Coronary artery disease     s/p DES to the right PD in September 2014  . Hyperlipidemia   . Chronic neck pain     Past Surgical History  Procedure Laterality Date  . Cardiac catheterization  10-04-08  . Coronary angioplasty with stent placement  05/2013  . Cervical spine surgery    . Left heart catheterization with coronary angiogram N/A 05/11/2013    Procedure: LEFT HEART CATHETERIZATION WITH CORONARY ANGIOGRAM;  Surgeon: Micheline ChapmanMichael D Orin Eberwein, MD;  [facility];  Service: Cardiovascular;  Laterality: N/A;    Current Outpatient Prescriptions  Medication Sig Dispense Refill  . aspirin 81 MG  tablet Take 1 tablet (81 mg total) by mouth daily. 1 tablet 0  . clopidogrel (PLAVIX) 75 MG tablet TAKE ONE TABLET BY MOUTH ONCE DAILY 30 tablet 1  . niacin (NIASPAN) 750 MG CR tablet Take 2 tablets (1,500 mg total) by mouth at bedtime. 180 tablet 3  . nitroGLYCERIN (NITROSTAT) 0.4 MG SL tablet Place 1 tablet (0.4 mg total) under the tongue every 5 (five) minutes as needed for chest pain. 25 tablet 3  . Omega-3 Fatty Acids (OMEGA-3 FISH OIL PO) Take 1,000-1,200 mg by mouth daily.     . simvastatin (ZOCOR) 40 MG tablet Take 20 mg by mouth daily.      No current facility-administered medications for this visit.    Allergies:   Review of patient's allergies indicates no known allergies.   Social History:  The patient  reports that he has quit smoking. He does not have any smokeless tobacco history on file. He reports that he does not drink alcohol or use illicit drugs.   Family History:  The patient's  family history includes Coronary artery disease in an other family member; Heart attack (age of onset: 5463) in his father.   ROS:  Please see the history of present illness.   All other systems are reviewed and negative.   PHYSICAL EXAM: VS:  BP 110/74 mmHg  Pulse 72  Ht 6' (1.829 m)  Wt 168 lb (76.204 kg)  BMI 22.78 kg/m2 , BMI Body mass index is 22.78 kg/(m^2). GEN: Well nourished, well developed, in no acute  distress HEENT: normal Neck: no JVD, carotid bruits, or masses Cardiac: RRR; no murmurs, rubs, or gallops,no edema  Respiratory:  clear to auscultation bilaterally, normal work of breathing GI: soft, nontender, nondistended, + BS MS: no deformity or atrophy Skin: warm and dry, no rash Neuro:  Strength and sensation are intact Psych: euthymic mood, full affect  EKG:  EKG is ordered today. The ekg ordered today shows normal sinus rhythm 72 bpm, nonspecific ST abnormality.  Recent Labs: 03/08/2014: BUN 16; Creatinine 0.89; Hemoglobin 14.7; Platelets 149*; Potassium 4.2; Pro B  Natriuretic peptide (BNP) 37.5; Sodium 140   Lipid Panel     Component Value Date/Time   CHOL 120 04/21/2013 1118   TRIG 82.0 04/21/2013 1118   HDL 40.40 04/21/2013 1118   CHOLHDL 3 04/21/2013 1118   VLDL 16.4 04/21/2013 1118   LDLCALC 63 04/21/2013 1118      Wt Readings from Last 3 Encounters:  09/18/14 168 lb (76.204 kg)  03/08/14 165 lb (74.844 kg)  08/17/13 162 lb (73.483 kg)     Other studies Reviewed: Myoview stress test 03/12/2014: IMPRESSION: Low risk stress Myoview with chest pain but no diagnostic electrocardiographic changes. The scintigraphic results show a prior inferior lateral infarct with mild peri-infarct ischemia. The gated ejection fraction was 69% with mild hypokinesis of the inferior lateral wall.  Cardiac catheterization/PCI 05/11/2013: Final Conclusions:  1. Severe single vessel CAD with successful PCI of the right PDA using a drug-eluting stent 2. Mild nonobstructive stenosis of the left main, LAD, and LCx   Recommendations:  Continue long-term DAPT with ASA and plavix.  ASSESSMENT AND PLAN: 1.  Coronary artery disease, native vessel, without symptoms of angina. The patient is stable on his current medical program. Since he is on long-term dual antiplatelet therapy after a history of stent thrombosis, I advised him to reduce his aspirin dose to 81 mg daily. He will continue on Plavix 75 mg daily. No other medication changes were made. I will see him back in one year.  2. Hyperlipidemia. Last lipids reviewed from August 2014. He continues on a, and niacin and simvastatin. Will panel. Lifestyle modification reviewed. Encouraged him to increase his exercise level.   Current medicines are reviewed with the patient today.  The patient does not have concerns regarding medicines.  The following changes have been made:  no change  Labs/ tests ordered today include: Lipids/LFT's    Orders Placed This Encounter  Procedures  . Lipid panel  .  Hepatic function panel  . EKG 12-Lead    Disposition:   FU with me in 12 months  Signed, Tonny Bollman, MD  09/18/2014 3:57 PM    [facility] [address] [place], [place], [place]  [address] Phone: [phone]; Fax: [phone]

## 2014-09-25 ENCOUNTER — Other Ambulatory Visit (INDEPENDENT_AMBULATORY_CARE_PROVIDER_SITE_OTHER): Payer: Medicare PPO | Admitting: *Deleted

## 2014-09-25 DIAGNOSIS — E7800 Pure hypercholesterolemia, unspecified: Secondary | ICD-10-CM

## 2014-09-25 DIAGNOSIS — I2510 Atherosclerotic heart disease of native coronary artery without angina pectoris: Secondary | ICD-10-CM

## 2014-09-25 LAB — LIPID PANEL
CHOLESTEROL: 127 mg/dL (ref 0–200)
HDL: 40.9 mg/dL (ref >39.00)
LDL Cholesterol: 69 mg/dL (ref 0–99)
NonHDL: 86.1
Total CHOL/HDL Ratio: 3
Triglycerides: 88 mg/dL (ref 0.0–149.0)
VLDL: 17.6 mg/dL (ref 0.0–40.0)

## 2014-09-25 LAB — HEPATIC FUNCTION PANEL
ALT: 26 U/L (ref 0–53)
AST: 21 U/L (ref 0–37)
Albumin: 4.3 g/dL (ref 3.5–5.2)
Alkaline Phosphatase: 72 U/L (ref 39–117)
Bilirubin, Direct: 0.2 mg/dL (ref 0.0–0.3)
Total Bilirubin: 0.5 mg/dL (ref 0.2–1.2)
Total Protein: 6.9 g/dL (ref 6.0–8.3)

## 2014-09-25 NOTE — Addendum Note (Signed)
Addended by: Tonita PhoenixBOWDEN, Dedrick Heffner K on: 09/25/2014 08:30 AM   Modules accepted: Orders

## 2015-02-20 DIAGNOSIS — E780 Pure hypercholesterolemia: Secondary | ICD-10-CM | POA: Diagnosis not present

## 2015-02-20 DIAGNOSIS — I700 Atherosclerosis of aorta: Secondary | ICD-10-CM | POA: Diagnosis not present

## 2015-02-20 DIAGNOSIS — I252 Old myocardial infarction: Secondary | ICD-10-CM | POA: Diagnosis not present

## 2015-02-20 DIAGNOSIS — M25559 Pain in unspecified hip: Secondary | ICD-10-CM | POA: Diagnosis not present

## 2015-02-20 DIAGNOSIS — Z1211 Encounter for screening for malignant neoplasm of colon: Secondary | ICD-10-CM | POA: Diagnosis not present

## 2015-02-20 DIAGNOSIS — I2510 Atherosclerotic heart disease of native coronary artery without angina pectoris: Secondary | ICD-10-CM | POA: Diagnosis not present

## 2015-02-25 ENCOUNTER — Other Ambulatory Visit: Payer: Self-pay

## 2015-03-13 DIAGNOSIS — M5430 Sciatica, unspecified side: Secondary | ICD-10-CM | POA: Diagnosis not present

## 2015-03-14 ENCOUNTER — Other Ambulatory Visit (HOSPITAL_BASED_OUTPATIENT_CLINIC_OR_DEPARTMENT_OTHER): Payer: Self-pay | Admitting: Family Medicine

## 2015-03-14 DIAGNOSIS — M5440 Lumbago with sciatica, unspecified side: Secondary | ICD-10-CM

## 2015-03-16 ENCOUNTER — Ambulatory Visit (HOSPITAL_BASED_OUTPATIENT_CLINIC_OR_DEPARTMENT_OTHER)
Admission: RE | Admit: 2015-03-16 | Discharge: 2015-03-16 | Disposition: A | Discharge status: Home / Self Care | Payer: Medicare PPO | Source: Ambulatory Visit | Attending: Family Medicine | Admitting: Family Medicine

## 2015-03-16 DIAGNOSIS — M5440 Lumbago with sciatica, unspecified side: Secondary | ICD-10-CM

## 2015-03-16 DIAGNOSIS — M4806 Spinal stenosis, lumbar region: Secondary | ICD-10-CM | POA: Diagnosis not present

## 2015-03-16 DIAGNOSIS — M5126 Other intervertebral disc displacement, lumbar region: Secondary | ICD-10-CM | POA: Diagnosis not present

## 2015-03-16 DIAGNOSIS — M545 Low back pain: Secondary | ICD-10-CM | POA: Diagnosis present

## 2015-04-01 ENCOUNTER — Other Ambulatory Visit: Payer: Self-pay | Admitting: Orthopedic Surgery

## 2015-04-01 DIAGNOSIS — M5416 Radiculopathy, lumbar region: Secondary | ICD-10-CM | POA: Diagnosis not present

## 2015-04-02 ENCOUNTER — Telehealth: Payer: Self-pay | Admitting: Cardiovascular Disease

## 2015-04-02 NOTE — Telephone Encounter (Signed)
I will forward this message to Dr Excell Seltzer to review.

## 2015-04-02 NOTE — Telephone Encounter (Signed)
New message   Request for surgical clearance:  1. What type of surgery is being performed? L4-5 micro-disectomy  2. When is this surgery scheduled? August 11  3. Are there any medications that need to be held prior to surgery and how long? Plavix 5 days prior and 2 days after    4. Name of physician performing surgery? Dr. Yevette Edwards  5. What is your office phone and fax number? [facility] [phone] Fax [phone]  Per office clearance request was faxed on July 26th per office Please advise

## 2015-04-03 ENCOUNTER — Encounter (HOSPITAL_COMMUNITY)
Admission: RE | Admit: 2015-04-03 | Discharge: 2015-04-03 | Disposition: A | Discharge status: Home / Self Care | Payer: Medicare PPO | Source: Ambulatory Visit | Attending: Orthopedic Surgery | Admitting: Orthopedic Surgery

## 2015-04-03 ENCOUNTER — Encounter (HOSPITAL_COMMUNITY): Payer: Self-pay

## 2015-04-03 DIAGNOSIS — Z01818 Encounter for other preprocedural examination: Secondary | ICD-10-CM

## 2015-04-03 DIAGNOSIS — M79605 Pain in left leg: Secondary | ICD-10-CM | POA: Diagnosis not present

## 2015-04-03 DIAGNOSIS — I2510 Atherosclerotic heart disease of native coronary artery without angina pectoris: Secondary | ICD-10-CM | POA: Diagnosis not present

## 2015-04-03 DIAGNOSIS — Z01812 Encounter for preprocedural laboratory examination: Secondary | ICD-10-CM | POA: Insufficient documentation

## 2015-04-03 LAB — CBC WITH DIFFERENTIAL/PLATELET
BASOS PCT: 0 % (ref 0–1)
Basophils Absolute: 0 10*3/uL (ref 0.0–0.1)
EOS ABS: 0.1 10*3/uL (ref 0.0–0.7)
EOS PCT: 2 % (ref 0–5)
HEMATOCRIT: 41.8 % (ref 39.0–52.0)
HEMOGLOBIN: 14.5 g/dL (ref 13.0–17.0)
LYMPHS ABS: 1.3 10*3/uL (ref 0.7–4.0)
Lymphocytes Relative: 22 % (ref 12–46)
MCH: 32.8 pg (ref 26.0–34.0)
MCHC: 34.7 g/dL (ref 30.0–36.0)
MCV: 94.6 fL (ref 78.0–100.0)
MONO ABS: 0.4 10*3/uL (ref 0.1–1.0)
MONOS PCT: 7 % (ref 3–12)
Neutro Abs: 3.9 10*3/uL (ref 1.7–7.7)
Neutrophils Relative %: 69 % (ref 43–77)
Platelets: 167 10*3/uL (ref 150–400)
RBC: 4.42 MIL/uL (ref 4.22–5.81)
RDW: 12.6 % (ref 11.5–15.5)
WBC: 5.8 10*3/uL (ref 4.0–10.5)

## 2015-04-03 LAB — COMPREHENSIVE METABOLIC PANEL
ALBUMIN: 4.5 g/dL (ref 3.5–5.0)
ALT: 51 U/L (ref 17–63)
ANION GAP: 8 (ref 5–15)
AST: 32 U/L (ref 15–41)
Alkaline Phosphatase: 61 U/L (ref 38–126)
BILIRUBIN TOTAL: 0.5 mg/dL (ref 0.3–1.2)
BUN: 7 mg/dL (ref 6–20)
CALCIUM: 9.7 mg/dL (ref 8.9–10.3)
CO2: 26 mmol/L (ref 22–32)
CREATININE: 0.83 mg/dL (ref 0.61–1.24)
Chloride: 105 mmol/L (ref 101–111)
GFR calc Af Amer: >60 mL/min (ref >60)
Glucose, Bld: 151 mg/dL — ABNORMAL HIGH (ref 65–99)
Potassium: 4.1 mmol/L (ref 3.5–5.1)
SODIUM: 139 mmol/L (ref 135–145)
Total Protein: 7 g/dL (ref 6.5–8.1)

## 2015-04-03 LAB — URINALYSIS, ROUTINE W REFLEX MICROSCOPIC
Bilirubin Urine: NEGATIVE
Glucose, UA: NEGATIVE mg/dL
HGB URINE DIPSTICK: NEGATIVE
KETONES UR: NEGATIVE mg/dL
Leukocytes, UA: NEGATIVE
Nitrite: NEGATIVE
PROTEIN: NEGATIVE mg/dL
SPECIFIC GRAVITY, URINE: 1.012 (ref 1.005–1.030)
Urobilinogen, UA: 0.2 mg/dL (ref 0.0–1.0)
pH: 7 (ref 5.0–8.0)

## 2015-04-03 LAB — SURGICAL PCR SCREEN
MRSA, PCR: NEGATIVE
Staphylococcus aureus: NEGATIVE

## 2015-04-03 LAB — PROTIME-INR
INR: 0.93 (ref 0.00–1.49)
PROTHROMBIN TIME: 12.7 s (ref 11.6–15.2)

## 2015-04-03 LAB — APTT: APTT: 26 s (ref 24–37)

## 2015-04-03 NOTE — Pre-Procedure Instructions (Signed)
[name]  04/03/2015      [facility] [address], [place] - [address] [place] [address] Phone: [phone] Fax: [phone]    Your procedure is scheduled on  Thursday  04/11/15  Report to [facility] Admitting at 800 A.M.  Call this number if you have problems the morning of surgery:  [phone]   Remember:  Do not eat food or drink liquids after midnight.  Take these medicines the morning of surgery with A SIP OF WATER  TYLENOL IF NEEDED (STOP FISH OIL, CHECK WITH CADIOLOGIST WHEN TO STOP ASPIRIN AND PLAVIX)   Do not wear jewelry, make-up or nail polish.  Do not wear lotions, powders, or perfumes.  You may wear deodorant.  Do not shave 48 hours prior to surgery.  Men may shave face and neck.  Do not bring valuables to the hospital.  [facility] is not responsible for any belongings or valuables.  Contacts, dentures or bridgework may not be worn into surgery.  Leave your suitcase in the car.  After surgery it may be brought to your room.  For patients admitted to the hospital, discharge time will be determined by your treatment team.  Patients discharged the day of surgery will not be allowed to drive home.   Name and phone number of your driver:   Special instructions:  [place] - Preparing for Surgery  Before surgery, you can play an important role.  Because skin is not sterile, your skin needs to be as free of germs as possible.  You can reduce the number of germs on you skin by washing with CHG (chlorahexidine gluconate) soap before surgery.  CHG is an antiseptic cleaner which kills germs and bonds with the skin to continue killing germs even after washing.  Please DO NOT use if you have an allergy to CHG or antibacterial soaps.  If your skin becomes reddened/irritated stop using the CHG and inform your nurse when you arrive at Short Stay.  Do not shave (including legs and underarms) for at least 48 hours prior to  the first CHG shower.  You may shave your face.  Please follow these instructions carefully:   1.  Shower with CHG Soap the night before surgery and the                                morning of Surgery.  2.  If you choose to wash your hair, wash your hair first as usual with your       normal shampoo.  3.  After you shampoo, rinse your hair and body thoroughly to remove the                      Shampoo.  4.  Use CHG as you would any other liquid soap.  You can apply chg directly       to the skin and wash gently with scrungie or a clean washcloth.  5.  Apply the CHG Soap to your body ONLY FROM THE NECK DOWN.        Do not use on open wounds or open sores.  Avoid contact with your eyes,       ears, mouth and genitals (private parts).  Wash genitals (private parts)       with your normal soap.  6.  Wash thoroughly, paying  special attention to the area where your surgery        will be performed.  7.  Thoroughly rinse your body with warm water from the neck down.  8.  DO NOT shower/wash with your normal soap after using and rinsing off       the CHG Soap.  9.  Pat yourself dry with a clean towel.            10.  Wear clean pajamas.            11.  Place clean sheets on your bed the night of your first shower and do not        sleep with pets.  Day of Surgery  Do not apply any lotions/deoderants the morning of surgery.  Please wear clean clothes to the hospital/surgery center.    Please read over the following fact sheets that you were given. Pain Booklet, Coughing and Deep Breathing, MRSA Information and Surgical Site Infection Prevention

## 2015-04-03 NOTE — Progress Notes (Signed)
SPOKE WITH CARLA AT DR. Marshell Levan OFFICE WHO STATED SHE HAD MESSAGED DR. Excell Seltzer RE; WHEN PATIENT COULD STOP PLAVIX + ASPIRIN. PATIENT INSTRUCTED TO CALL OFFICE RE: STOPPING PLAVIX AND ASPIRIN IF HE HAS NOT HEARD FROM DR. Earmon Phoenix OFFICE.

## 2015-04-04 NOTE — Telephone Encounter (Signed)
I will forward this message to Tereso Newcomer PA-C to review in Dr Cooper's absence.

## 2015-04-04 NOTE — Telephone Encounter (Signed)
Reviewed case adn patient's records with L. Manson Passey, RN. Patient had recent low risk Myoview. Last PCI > 12 mos ago. Ok to hold Plavix for surgery and resume post op when safe. Patient should remain on ASA. Tereso Newcomer, PA-C   04/04/2015 1:22 PM

## 2015-04-04 NOTE — Telephone Encounter (Signed)
This phone note was faxed to [phone].

## 2015-04-10 MED ORDER — CEFAZOLIN SODIUM-DEXTROSE 2-3 GM-% IV SOLR
2.0000 g | INTRAVENOUS | Status: AC
  Administered 2015-04-11: 2 g via INTRAVENOUS
  Filled 2015-04-10: qty 50

## 2015-04-10 MED ORDER — POVIDONE-IODINE 7.5 % EX SOLN
CUTANEOUS | Status: AC
  Filled 2015-04-10: qty 118

## 2015-04-10 NOTE — H&P (Signed)
     PREOPERATIVE H&P  Chief Complaint: Left leg pain  HPI: [name] is a 69 y.o. male who presents with ongoing pain in the left leg  MRI reveals left L4/5 HNP  Patient has failed multiple forms of conservative care and continues to have pain (see office notes for additional details regarding the patient's full course of treatment)  Past Medical History  Diagnosis Date  . Myocardial infarct 09-11-06, 2009  . Coronary artery disease     s/p DES to the right PD in September 2014  . Hyperlipidemia   . Chronic neck pain    Past Surgical History  Procedure Laterality Date  . Cardiac catheterization  10-04-08  . Coronary angioplasty with stent placement  05/2013  . Cervical spine surgery    . Left heart catheterization with coronary angiogram N/A 05/11/2013    Procedure: LEFT HEART CATHETERIZATION WITH CORONARY ANGIOGRAM;  Surgeon: Micheline Chapman, MD;  Location: [facility] CATH LAB;  Service: Cardiovascular;  Laterality: N/A;   Social History   Social History  . Marital Status: Married    Spouse Name: N/A  . Number of Children: 1  . Years of Education: N/A   Occupational History  . retired Administrator, arts    Social History Main Topics  . Smoking status: Former Games developer  . Smokeless tobacco: Not on file  . Alcohol Use: No  . Drug Use: No  . Sexual Activity: Not Currently   Other Topics Concern  . Not on file   Social History Narrative   Family History  Problem Relation Age of Onset  . Heart attack Father 52    died  . Coronary artery disease      siblings   No Known Allergies Prior to Admission medications   Medication Sig Start Date End Date Taking? Authorizing Provider  acetaminophen (TYLENOL) 650 MG CR tablet Take 650 mg by mouth every 8 (eight) hours as needed for pain.   Yes Historical Provider, MD  aspirin 81 MG tablet Take 1 tablet (81 mg total) by mouth daily. 09/18/14  Yes Tonny Bollman, MD  clopidogrel (PLAVIX) 75 MG tablet Take 1 tablet (75 mg total) by  mouth daily. 09/18/14  Yes Tonny Bollman, MD  niacin (NIASPAN) 750 MG CR tablet Take 2 tablets (1,500 mg total) by mouth at bedtime. 09/18/14  Yes Tonny Bollman, MD  nitroGLYCERIN (NITROSTAT) 0.4 MG SL tablet Place 1 tablet (0.4 mg total) under the tongue every 5 (five) minutes as needed for chest pain. 05/12/13  Yes Rhonda G Barrett, PA-C  Omega-3 Fatty Acids (OMEGA-3 FISH OIL PO) Take 1 g by mouth daily.    Yes Historical Provider, MD  simvastatin (ZOCOR) 40 MG tablet Take 0.5 tablets (20 mg total) by mouth daily. 09/18/14  Yes Tonny Bollman, MD     All other systems have been reviewed and were otherwise negative with the exception of those mentioned in the HPI and as above.  Physical Exam: There were no vitals filed for this visit.  General: Alert, no acute distress Cardiovascular: No pedal edema Respiratory: No cyanosis, no use of accessory musculature Skin: No lesions in the area of chief complaint Neurologic: Sensation intact distally Psychiatric: Patient is competent for consent with normal mood and affect Lymphatic: No axillary or cervical lymphadenopathy  MUSCULOSKELETAL: + SLR on left  Assessment/Plan: Left leg pain Plan for Procedure(s): LUMBAR MICRODISCECTOMY, L4/5   Emilee Hero, MD 04/10/2015 9:10 AM

## 2015-04-10 NOTE — Progress Notes (Signed)
Called pt and informed him that surgery time had changed and that he should arrive at 0530 with stated understanding.

## 2015-04-11 ENCOUNTER — Encounter (HOSPITAL_COMMUNITY)
Admission: RE | Disposition: A | Discharge status: Home / Self Care | Payer: Self-pay | Source: Ambulatory Visit | Attending: Orthopedic Surgery

## 2015-04-11 ENCOUNTER — Ambulatory Visit (HOSPITAL_COMMUNITY): Payer: Medicare PPO

## 2015-04-11 ENCOUNTER — Ambulatory Visit (HOSPITAL_COMMUNITY)
Admission: RE | Admit: 2015-04-11 | Discharge: 2015-04-11 | Disposition: A | Discharge status: Home / Self Care | Payer: Medicare PPO | Source: Ambulatory Visit | Attending: Orthopedic Surgery | Admitting: Orthopedic Surgery

## 2015-04-11 ENCOUNTER — Ambulatory Visit (HOSPITAL_COMMUNITY): Payer: Medicare PPO | Admitting: Anesthesiology

## 2015-04-11 ENCOUNTER — Encounter (HOSPITAL_COMMUNITY): Payer: Self-pay | Admitting: *Deleted

## 2015-04-11 DIAGNOSIS — Z87891 Personal history of nicotine dependence: Secondary | ICD-10-CM | POA: Insufficient documentation

## 2015-04-11 DIAGNOSIS — I252 Old myocardial infarction: Secondary | ICD-10-CM | POA: Insufficient documentation

## 2015-04-11 DIAGNOSIS — Z955 Presence of coronary angioplasty implant and graft: Secondary | ICD-10-CM | POA: Diagnosis not present

## 2015-04-11 DIAGNOSIS — M5417 Radiculopathy, lumbosacral region: Secondary | ICD-10-CM | POA: Diagnosis not present

## 2015-04-11 DIAGNOSIS — E785 Hyperlipidemia, unspecified: Secondary | ICD-10-CM | POA: Insufficient documentation

## 2015-04-11 DIAGNOSIS — M5410 Radiculopathy, site unspecified: Secondary | ICD-10-CM

## 2015-04-11 DIAGNOSIS — Z8249 Family history of ischemic heart disease and other diseases of the circulatory system: Secondary | ICD-10-CM | POA: Diagnosis not present

## 2015-04-11 DIAGNOSIS — M5416 Radiculopathy, lumbar region: Secondary | ICD-10-CM | POA: Insufficient documentation

## 2015-04-11 DIAGNOSIS — Z7902 Long term (current) use of antithrombotics/antiplatelets: Secondary | ICD-10-CM | POA: Insufficient documentation

## 2015-04-11 DIAGNOSIS — M79605 Pain in left leg: Secondary | ICD-10-CM | POA: Diagnosis not present

## 2015-04-11 DIAGNOSIS — Z7982 Long term (current) use of aspirin: Secondary | ICD-10-CM | POA: Insufficient documentation

## 2015-04-11 DIAGNOSIS — Z9889 Other specified postprocedural states: Secondary | ICD-10-CM | POA: Diagnosis not present

## 2015-04-11 DIAGNOSIS — M542 Cervicalgia: Secondary | ICD-10-CM | POA: Diagnosis not present

## 2015-04-11 DIAGNOSIS — I2510 Atherosclerotic heart disease of native coronary artery without angina pectoris: Secondary | ICD-10-CM | POA: Insufficient documentation

## 2015-04-11 HISTORY — PX: LUMBAR LAMINECTOMY/DECOMPRESSION MICRODISCECTOMY: SHX5026

## 2015-04-11 SURGERY — LUMBAR LAMINECTOMY/DECOMPRESSION MICRODISCECTOMY
Anesthesia: General | Site: Spine Lumbar | Laterality: Left

## 2015-04-11 MED ORDER — BUPIVACAINE-EPINEPHRINE 0.25% -1:200000 IJ SOLN
INTRAMUSCULAR | Status: DC | PRN
  Administered 2015-04-11: 23 mL

## 2015-04-11 MED ORDER — MIDAZOLAM HCL 2 MG/2ML IJ SOLN
INTRAMUSCULAR | Status: AC
  Filled 2015-04-11: qty 4

## 2015-04-11 MED ORDER — LACTATED RINGERS IV SOLN
INTRAVENOUS | Status: DC | PRN
  Administered 2015-04-11 (×2): via INTRAVENOUS

## 2015-04-11 MED ORDER — PROPOFOL 10 MG/ML IV BOLUS
INTRAVENOUS | Status: AC
  Filled 2015-04-11: qty 20

## 2015-04-11 MED ORDER — BUPIVACAINE-EPINEPHRINE (PF) 0.25% -1:200000 IJ SOLN
INTRAMUSCULAR | Status: AC
  Filled 2015-04-11: qty 30

## 2015-04-11 MED ORDER — CLOPIDOGREL BISULFATE 75 MG PO TABS
75.0000 mg | ORAL_TABLET | Freq: Every day | ORAL | Status: DC
Start: 2015-04-11 — End: 2016-08-11

## 2015-04-11 MED ORDER — LIDOCAINE HCL (CARDIAC) 20 MG/ML IV SOLN
INTRAVENOUS | Status: AC
  Filled 2015-04-11: qty 10

## 2015-04-11 MED ORDER — OXYCODONE-ACETAMINOPHEN 5-325 MG PO TABS
ORAL_TABLET | ORAL | Status: AC
  Filled 2015-04-11: qty 1

## 2015-04-11 MED ORDER — ROCURONIUM BROMIDE 50 MG/5ML IV SOLN
INTRAVENOUS | Status: AC
  Filled 2015-04-11: qty 2

## 2015-04-11 MED ORDER — OXYCODONE HCL 5 MG PO TABS: 5.0000 mg | ORAL_TABLET | Freq: Once | ORAL | Status: DC | PRN

## 2015-04-11 MED ORDER — 0.9 % SODIUM CHLORIDE (POUR BTL) OPTIME
TOPICAL | Status: DC | PRN
  Administered 2015-04-11: 1000 mL

## 2015-04-11 MED ORDER — ONDANSETRON HCL 4 MG/2ML IJ SOLN
INTRAMUSCULAR | Status: DC | PRN
  Administered 2015-04-11: 4 mg via INTRAVENOUS

## 2015-04-11 MED ORDER — GLYCOPYRROLATE 0.2 MG/ML IJ SOLN
INTRAMUSCULAR | Status: DC | PRN
  Administered 2015-04-11: .4 mg via INTRAVENOUS

## 2015-04-11 MED ORDER — ONDANSETRON HCL 4 MG/2ML IJ SOLN
INTRAMUSCULAR | Status: AC
  Filled 2015-04-11: qty 2

## 2015-04-11 MED ORDER — METHYLPREDNISOLONE ACETATE 40 MG/ML IJ SUSP
INTRAMUSCULAR | Status: AC
  Filled 2015-04-11: qty 1

## 2015-04-11 MED ORDER — HYDROMORPHONE HCL 1 MG/ML IJ SOLN
0.2500 mg | INTRAMUSCULAR | Status: DC | PRN
  Administered 2015-04-11: 0.25 mg via INTRAVENOUS

## 2015-04-11 MED ORDER — FENTANYL CITRATE (PF) 250 MCG/5ML IJ SOLN
INTRAMUSCULAR | Status: DC | PRN
  Administered 2015-04-11 (×2): 100 ug via INTRAVENOUS
  Administered 2015-04-11: 50 ug via INTRAVENOUS

## 2015-04-11 MED ORDER — ONDANSETRON HCL 4 MG/2ML IJ SOLN: 4.0000 mg | Freq: Once | INTRAMUSCULAR | Status: DC | PRN

## 2015-04-11 MED ORDER — INDIGOTINDISULFONATE SODIUM 8 MG/ML IJ SOLN
INTRAMUSCULAR | Status: DC | PRN
  Administered 2015-04-11: 1 mL via INTRAVENOUS

## 2015-04-11 MED ORDER — OXYCODONE HCL 5 MG/5ML PO SOLN: 5.0000 mg | Freq: Once | ORAL | Status: DC | PRN

## 2015-04-11 MED ORDER — DEXAMETHASONE SODIUM PHOSPHATE 10 MG/ML IJ SOLN
INTRAMUSCULAR | Status: AC
  Filled 2015-04-11: qty 1

## 2015-04-11 MED ORDER — GLYCOPYRROLATE 0.2 MG/ML IJ SOLN
INTRAMUSCULAR | Status: AC
  Filled 2015-04-11: qty 3

## 2015-04-11 MED ORDER — HEMOSTATIC AGENTS (NO CHARGE) OPTIME
TOPICAL | Status: DC | PRN
  Administered 2015-04-11: 1 via TOPICAL

## 2015-04-11 MED ORDER — OXYCODONE-ACETAMINOPHEN 5-325 MG PO TABS
1.0000 | ORAL_TABLET | ORAL | Status: DC | PRN
  Administered 2015-04-11: 1 via ORAL

## 2015-04-11 MED ORDER — HYDROMORPHONE HCL 1 MG/ML IJ SOLN
INTRAMUSCULAR | Status: DC
Start: 2015-04-11 — End: 2015-04-11
  Filled 2015-04-11: qty 1

## 2015-04-11 MED ORDER — DEXAMETHASONE SODIUM PHOSPHATE 10 MG/ML IJ SOLN
INTRAMUSCULAR | Status: DC | PRN
  Administered 2015-04-11: 10 mg via INTRAVENOUS

## 2015-04-11 MED ORDER — METHYLENE BLUE 1 % INJ SOLN
INTRAMUSCULAR | Status: AC
  Filled 2015-04-11: qty 10

## 2015-04-11 MED ORDER — MIDAZOLAM HCL 5 MG/5ML IJ SOLN
INTRAMUSCULAR | Status: DC | PRN
  Administered 2015-04-11: 2 mg via INTRAVENOUS

## 2015-04-11 MED ORDER — PHENYLEPHRINE 40 MCG/ML (10ML) SYRINGE FOR IV PUSH (FOR BLOOD PRESSURE SUPPORT)
PREFILLED_SYRINGE | INTRAVENOUS | Status: AC
  Filled 2015-04-11: qty 10

## 2015-04-11 MED ORDER — LIDOCAINE HCL (CARDIAC) 20 MG/ML IV SOLN
INTRAVENOUS | Status: DC | PRN
  Administered 2015-04-11: 100 mg via INTRAVENOUS

## 2015-04-11 MED ORDER — PROPOFOL 10 MG/ML IV BOLUS
INTRAVENOUS | Status: DC | PRN
  Administered 2015-04-11: 140 mg via INTRAVENOUS

## 2015-04-11 MED ORDER — THROMBIN 20000 UNITS EX SOLR
CUTANEOUS | Status: DC | PRN
  Administered 2015-04-11: 20 mL via TOPICAL

## 2015-04-11 MED ORDER — FENTANYL CITRATE (PF) 250 MCG/5ML IJ SOLN
INTRAMUSCULAR | Status: AC
  Filled 2015-04-11: qty 5

## 2015-04-11 MED ORDER — NEOSTIGMINE METHYLSULFATE 10 MG/10ML IV SOLN
INTRAVENOUS | Status: DC | PRN
  Administered 2015-04-11: 3 mg via INTRAVENOUS

## 2015-04-11 MED ORDER — THROMBIN 20000 UNITS EX SOLR
CUTANEOUS | Status: AC
  Filled 2015-04-11: qty 20000

## 2015-04-11 MED ORDER — PHENYLEPHRINE HCL 10 MG/ML IJ SOLN
INTRAMUSCULAR | Status: DC | PRN
  Administered 2015-04-11 (×2): 120 ug via INTRAVENOUS

## 2015-04-11 MED ORDER — METHYLPREDNISOLONE ACETATE 40 MG/ML IJ SUSP
INTRAMUSCULAR | Status: DC | PRN
  Administered 2015-04-11: 40 mg

## 2015-04-11 MED ORDER — ROCURONIUM BROMIDE 100 MG/10ML IV SOLN
INTRAVENOUS | Status: DC | PRN
  Administered 2015-04-11: 40 mg via INTRAVENOUS

## 2015-04-11 MED ORDER — NEOSTIGMINE METHYLSULFATE 10 MG/10ML IV SOLN
INTRAVENOUS | Status: AC
  Filled 2015-04-11: qty 4

## 2015-04-11 SURGICAL SUPPLY — 77 items
APL SKNCLS STERI-STRIP NONHPOA (GAUZE/BANDAGES/DRESSINGS)
BENZOIN TINCTURE PRP APPL 2/3 (GAUZE/BANDAGES/DRESSINGS) IMPLANT
BUR ROUND PRECISION 4.0 (BURR) ×2 IMPLANT
BUR ROUND PRECISION 4.0MM (BURR) ×1
CANISTER SUCTION 2500CC (MISCELLANEOUS) ×3 IMPLANT
CARTRIDGE OIL MAESTRO DRILL (MISCELLANEOUS) ×1 IMPLANT
CLOSURE STERI-STRIP 1/2X4 (GAUZE/BANDAGES/DRESSINGS) ×1
CLOSURE WOUND 1/2 X4 (GAUZE/BANDAGES/DRESSINGS)
CLSR STERI-STRIP ANTIMIC 1/2X4 (GAUZE/BANDAGES/DRESSINGS) ×1 IMPLANT
CORDS BIPOLAR (ELECTRODE) ×3 IMPLANT
COVER SURGICAL LIGHT HANDLE (MISCELLANEOUS) ×3 IMPLANT
DIFFUSER DRILL AIR PNEUMATIC (MISCELLANEOUS) ×3 IMPLANT
DRAIN CHANNEL 15F RND FF W/TCR (WOUND CARE) IMPLANT
DRAPE POUCH INSTRU U-SHP 10X18 (DRAPES) ×6 IMPLANT
DRAPE SURG 17X23 STRL (DRAPES) ×12 IMPLANT
DURAPREP 26ML APPLICATOR (WOUND CARE) ×3 IMPLANT
ELECT BLADE 4.0 EZ CLEAN MEGAD (MISCELLANEOUS)
ELECT CAUTERY BLADE 6.4 (BLADE) ×3 IMPLANT
ELECT REM PT RETURN 9FT ADLT (ELECTROSURGICAL) ×3
ELECTRODE BLDE 4.0 EZ CLN MEGD (MISCELLANEOUS) IMPLANT
ELECTRODE REM PT RTRN 9FT ADLT (ELECTROSURGICAL) ×1 IMPLANT
EVACUATOR SILICONE 100CC (DRAIN) IMPLANT
FILTER STRAW FLUID ASPIR (MISCELLANEOUS) ×3 IMPLANT
GAUZE SPONGE 4X4 12PLY STRL (GAUZE/BANDAGES/DRESSINGS) ×3 IMPLANT
GAUZE SPONGE 4X4 16PLY XRAY LF (GAUZE/BANDAGES/DRESSINGS) ×6 IMPLANT
GLOVE BIO SURGEON STRL SZ7 (GLOVE) ×3 IMPLANT
GLOVE BIO SURGEON STRL SZ8 (GLOVE) ×3 IMPLANT
GLOVE BIOGEL PI IND STRL 7.0 (GLOVE) ×1 IMPLANT
GLOVE BIOGEL PI IND STRL 8 (GLOVE) ×1 IMPLANT
GLOVE BIOGEL PI INDICATOR 7.0 (GLOVE) ×2
GLOVE BIOGEL PI INDICATOR 8 (GLOVE) ×2
GOWN STRL REUS W/ TWL LRG LVL3 (GOWN DISPOSABLE) ×1 IMPLANT
GOWN STRL REUS W/ TWL XL LVL3 (GOWN DISPOSABLE) ×2 IMPLANT
GOWN STRL REUS W/TWL LRG LVL3 (GOWN DISPOSABLE) ×3
GOWN STRL REUS W/TWL XL LVL3 (GOWN DISPOSABLE) ×6
IV CATH 14GX2 1/4 (CATHETERS) ×3 IMPLANT
KIT BASIN OR (CUSTOM PROCEDURE TRAY) ×3 IMPLANT
KIT POSITION SURG JACKSON T1 (MISCELLANEOUS) ×3 IMPLANT
KIT ROOM TURNOVER OR (KITS) ×3 IMPLANT
NDL 18GX1X1/2 (RX/OR ONLY) (NEEDLE) ×1 IMPLANT
NDL HYPO 25GX1X1/2 BEV (NEEDLE) ×1 IMPLANT
NDL SPNL 18GX3.5 QUINCKE PK (NEEDLE) ×2 IMPLANT
NEEDLE 18GX1X1/2 (RX/OR ONLY) (NEEDLE) ×3 IMPLANT
NEEDLE 22X1 1/2 (OR ONLY) (NEEDLE) ×3 IMPLANT
NEEDLE HYPO 25GX1X1/2 BEV (NEEDLE) ×3 IMPLANT
NEEDLE SPNL 18GX3.5 QUINCKE PK (NEEDLE) ×6 IMPLANT
NS IRRIG 1000ML POUR BTL (IV SOLUTION) ×3 IMPLANT
OIL CARTRIDGE MAESTRO DRILL (MISCELLANEOUS) ×3
PACK LAMINECTOMY ORTHO (CUSTOM PROCEDURE TRAY) ×3 IMPLANT
PACK UNIVERSAL I (CUSTOM PROCEDURE TRAY) ×3 IMPLANT
PAD ARMBOARD 7.5X6 YLW CONV (MISCELLANEOUS) ×6 IMPLANT
PATTIES SURGICAL .5 X.5 (GAUZE/BANDAGES/DRESSINGS) IMPLANT
PATTIES SURGICAL .5 X1 (DISPOSABLE) ×3 IMPLANT
SPONGE GAUZE 4X4 12PLY STER LF (GAUZE/BANDAGES/DRESSINGS) ×2 IMPLANT
SPONGE INTESTINAL PEANUT (DISPOSABLE) ×3 IMPLANT
SPONGE SURGIFOAM ABS GEL 100 (HEMOSTASIS) ×3 IMPLANT
SPONGE SURGIFOAM ABS GEL SZ50 (HEMOSTASIS) ×3 IMPLANT
STRIP CLOSURE SKIN 1/2X4 (GAUZE/BANDAGES/DRESSINGS) IMPLANT
SURGIFLO W/THROMBIN 8M KIT (HEMOSTASIS) ×2 IMPLANT
SUT MNCRL AB 4-0 PS2 18 (SUTURE) ×3 IMPLANT
SUT VIC AB 0 CT1 18XCR BRD 8 (SUTURE) IMPLANT
SUT VIC AB 0 CT1 27 (SUTURE)
SUT VIC AB 0 CT1 27XBRD ANBCTR (SUTURE) IMPLANT
SUT VIC AB 0 CT1 8-18 (SUTURE)
SUT VIC AB 1 CT1 18XCR BRD 8 (SUTURE) ×1 IMPLANT
SUT VIC AB 1 CT1 8-18 (SUTURE) ×3
SUT VIC AB 2-0 CT2 18 VCP726D (SUTURE) ×3 IMPLANT
SYR 20CC LL (SYRINGE) IMPLANT
SYR BULB IRRIGATION 50ML (SYRINGE) ×3 IMPLANT
SYR CONTROL 10ML LL (SYRINGE) ×6 IMPLANT
SYR TB 1ML 26GX3/8 SAFETY (SYRINGE) ×6 IMPLANT
SYR TB 1ML LUER SLIP (SYRINGE) ×6 IMPLANT
TAPE CLOTH SURG 4X10 WHT LF (GAUZE/BANDAGES/DRESSINGS) ×2 IMPLANT
TOWEL OR 17X24 6PK STRL BLUE (TOWEL DISPOSABLE) ×3 IMPLANT
TOWEL OR 17X26 10 PK STRL BLUE (TOWEL DISPOSABLE) ×3 IMPLANT
WATER STERILE IRR 1000ML POUR (IV SOLUTION) ×3 IMPLANT
YANKAUER SUCT BULB TIP NO VENT (SUCTIONS) ×3 IMPLANT

## 2015-04-11 NOTE — Anesthesia Postprocedure Evaluation (Signed)
  Anesthesia Post-op Note  Patient: [name]  Procedure(s) Performed: Procedure(s) with comments: LUMBAR LAMINECTOMY/DECOMPRESSION MICRODISCECTOMY (Left) - Left sided lumbar 4-5 microdisectomy  Patient Location: PACU  Anesthesia Type:General  Level of Consciousness: awake, alert  and oriented  Airway and Oxygen Therapy: Patient Spontanous Breathing and Patient connected to nasal cannula oxygen  Post-op Pain: mild  Post-op Assessment: Post-op Vital signs reviewed, Patient's Cardiovascular Status Stable, Respiratory Function Stable, Patent Airway and No signs of Nausea or vomiting LLE Motor Response: Responds to commands   RLE Motor Response: Responds to commands        Post-op Vital Signs: stable  Last Vitals:  Filed Vitals:   04/11/15 1039  BP: 135/68  Pulse: 73  Temp:   Resp: 18    Complications: No apparent anesthesia complications

## 2015-04-11 NOTE — Anesthesia Preprocedure Evaluation (Signed)
Anesthesia Evaluation  Patient identified by MRN, date of birth, ID band Patient awake    Reviewed: Allergy & Precautions, NPO status , Patient's Chart, lab work & pertinent test results  Airway Mallampati: II  TM Distance: >3 FB Neck ROM: Full    Dental  (+) Teeth Intact, Dental Advisory Given   Pulmonary former smoker,  breath sounds clear to auscultation        Cardiovascular Rhythm:Regular Rate:Normal     Neuro/Psych    GI/Hepatic   Endo/Other    Renal/GU      Musculoskeletal   Abdominal   Peds  Hematology   Anesthesia Other Findings   Reproductive/Obstetrics                             Anesthesia Physical Anesthesia Plan  ASA: III  Anesthesia Plan: General   Post-op Pain Management:    Induction: Intravenous  Airway Management Planned: Oral ETT  Additional Equipment:   Intra-op Plan:   Post-operative Plan: Extubation in OR  Informed Consent: I have reviewed the patients History and Physical, chart, labs and discussed the procedure including the risks, benefits and alternatives for the proposed anesthesia with the patient or authorized representative who has indicated his/her understanding and acceptance.   Dental advisory given  Plan Discussed with: CRNA and Anesthesiologist  Anesthesia Plan Comments: (HNP L4-5 S/P C2 fracture 1984 with reduced neck ROM CAD S/P PTCAs of RCA low risk ytoview 02/2014 no angina, last took plavix 8/5  Plan GA with oral ETT  Kipp Brood)        Anesthesia Quick Evaluation

## 2015-04-11 NOTE — Transfer of Care (Signed)
Immediate Anesthesia Transfer of Care Note  Patient: [name]  Procedure(s) Performed: Procedure(s) with comments: LUMBAR LAMINECTOMY/DECOMPRESSION MICRODISCECTOMY (Left) - Left sided lumbar 4-5 microdisectomy  Patient Location: PACU  Anesthesia Type:General  Level of Consciousness: awake, alert  and oriented  Airway & Oxygen Therapy: Patient Spontanous Breathing and Patient connected to nasal cannula oxygen  Post-op Assessment: Report given to RN, Post -op Vital signs reviewed and stable and Patient moving all extremities X 4  Post vital signs: Reviewed and stable  Last Vitals:  Filed Vitals:   04/11/15 0931  BP: 133/67  Pulse: 80  Temp: 36.6 C  Resp: 14    Complications: No apparent anesthesia complications

## 2015-04-11 NOTE — Anesthesia Procedure Notes (Signed)
Procedure Name: Intubation Performed by: Marena Chancy Patient Re-evaluated:Patient Re-evaluated prior to inductionOxygen Delivery Method: Circle system utilized Preoxygenation: Pre-oxygenation with 100% oxygen Intubation Type: IV induction Ventilation: Mask ventilation without difficulty Laryngoscope Size: Miller and 3 Grade View: Grade II Tube type: Oral Tube size: 7.5 mm Number of attempts: 1 Placement Confirmation: ETT inserted through vocal cords under direct vision,  positive ETCO2 and breath sounds checked- equal and bilateral Tube secured with: Tape Dental Injury: Teeth and Oropharynx as per pre-operative assessment

## 2015-04-12 ENCOUNTER — Encounter (HOSPITAL_COMMUNITY): Payer: Self-pay | Admitting: Orthopedic Surgery

## 2015-04-12 NOTE — Op Note (Signed)
NAME:  MACLOVIO, HENSON NO.:  [id number]  MEDICAL RECORD NO.:  [id number]  LOCATION:  [facility]                         FACILITY:  [facility]  PHYSICIAN:  Estill Bamberg, MD      DATE OF BIRTH:  1946/02/20  DATE OF PROCEDURE:  04/11/2015 DATE OF DISCHARGE:  04/11/2015                              OPERATIVE REPORT   PREOPERATIVE DIAGNOSIS:  Left-sided L5 radiculopathy.  POSTOPERATIVE DIAGNOSES:  Left-sided L5 radiculopathy.  PROCEDURE:  Left-sided L4-5 microdiskectomy.  SURGEON:  Estill Bamberg, MD  ASSISTANT:  Jason Coop, PA-C.  ANESTHESIA:  General endotracheal anesthesia.  COMPLICATIONS:  None.  DISPOSITION:  Stable.  ESTIMATED BLOOD LOSS:  Minimal.  INDICATIONS FOR SURGERY:  Briefly, Mr. Teaster is a very pleasant 69 year old male, who did present to me with a 6-week history of pain in his left leg with ongoing pain in his left leg.  He did have weakness both subjectively and objectively.  An MRI did reveal a moderate left L4-5 disk herniation compressing the left L5 nerve, which I did feel clearly corresponded to the patient's pain distribution.  Given his lack of improvement with conservative measures, we did discuss proceeding with the procedure noted above.  OPERATIVE DETAILS:  On April 11, 2015, the patient was brought to surgery and general endotracheal anesthesia was administered.  The patient was placed prone on a well-padded flat Jackson bed with a spinal frame.  Antibiotics were given and a time-out procedure was performed. The back was prepped and draped and a midline incision was made, approximately 1 inch in length in the paraspinal musculature.  The fascia was incised in a curvilinear fashion on the left side of the midline.  The paraspinal musculature was retracted and a self-retaining retractor was placed.  I did use a high-speed bur to remove the medial and inferior aspect of the L5 lamina.  The ligamentum flavum was identified.  There  was overgrowth of the ligamentum flavum resulting in moderate spinal stenosis.  This was removed.  The traversing L5 nerve was identified and noted to be under moderate compression.  I did gently retract the nerve medially.  Immediately beneath the nerve was noted to be a prominent L4-5 disc herniation.  I did use a Penfield 4 to introduce downward pressure to the outer aspect of the disc.  Multiple fragments did readily declare themselves, and they were removed in 3 large fragments.  In doing so, I was able to thoroughly and entirely decompress the lateral recess.  Minor areas of epidural bleeding were controlled using bipolar electrocautery in addition to Surgiflo.  A 40 mg of Depo-Medrol was introduced about the epidural space in the region of the left L5 nerve.  Prior to placing the steroids, the wound was copiously irrigated with approximately 500 mL of normal saline.  The fascia was then closed using #1 Vicryl.  The subcutaneous layer was then closed using 2-0 Vicryl.  The skin was closed using 3-0 Monocryl. Benzoin and Steri-Strips were applied followed by sterile dressing.  All instrument counts were correct at the termination of the procedure. There was no extravasation of cerebrospinal fluid noted throughout the entire surgery.  Of  note, Jason Coop was my assistant throughout surgery, and did aid in retraction, suctioning, and closure throughout surgery from start to finish.     Estill Bamberg, MD     MD/MEDQ  D:  04/11/2015  T:  04/12/2015  Job:  [id number]  cc:   Joycelyn Rua, M.D. Veverly Fells. Excell Seltzer, MD

## 2015-05-21 ENCOUNTER — Telehealth: Payer: Self-pay | Admitting: Cardiovascular Disease

## 2015-05-21 NOTE — Telephone Encounter (Signed)
Patient called c/o sob.  York Spaniel this has been going on for a month or two but worse lately.  Said he is lethargic with weakness.  Has sob and increased hr with ambulation.  Says he has tension in upper chest.  Hasn't been able to sleep very well, can only sleep a couple of hours at a time.  Has some b/l feet swelling, says his socks leave an indention.  Says his weight hasn't changed but his waist is larger now.  Had spinal surgery on 04-11-15.  When x-rays were done,radiologist commented on significant plaque buildup in aorta.  Patient seems very anxious, says he feels like he is "stopping up again".

## 2015-05-21 NOTE — Telephone Encounter (Signed)
Talked to DOD, advised to come in and see the flex.  Made appt with Corine Shelter at 1100 05-23-15.   Called patient and advised of his appt time/date. Patient expressed understanding.

## 2015-05-21 NOTE — Telephone Encounter (Signed)
New Message  Pt c/o of chest tightness and SoB also pt stated During xrays for prep on spinal surgery- pt stated there was significant plaque buildup in aorta- per Radiologist.    Pt c/o Shortness Of Breath: STAT if SOB developed within the last 24 hours or pt is noticeably SOB on the phone  1. Are you currently SOB (can you hear that pt is SOB on the phone)? yes  2. How long have you been experiencing SOB? 4 weeks or so  3. Are you SOB when sitting or when up moving around? Moving around   4. Are you currently experiencing any other symptoms? Weak

## 2015-05-23 ENCOUNTER — Ambulatory Visit (INDEPENDENT_AMBULATORY_CARE_PROVIDER_SITE_OTHER): Payer: Medicare PPO | Admitting: Cardiology

## 2015-05-23 ENCOUNTER — Encounter: Payer: Self-pay | Admitting: Cardiology

## 2015-05-23 VITALS — BP 130/70 | HR 73 | Ht 72.0 in | Wt 160.0 lb

## 2015-05-23 DIAGNOSIS — Z9861 Coronary angioplasty status: Secondary | ICD-10-CM

## 2015-05-23 DIAGNOSIS — M47817 Spondylosis without myelopathy or radiculopathy, lumbosacral region: Secondary | ICD-10-CM

## 2015-05-23 DIAGNOSIS — R0600 Dyspnea, unspecified: Secondary | ICD-10-CM

## 2015-05-23 DIAGNOSIS — R0609 Other forms of dyspnea: Secondary | ICD-10-CM | POA: Diagnosis not present

## 2015-05-23 DIAGNOSIS — M5137 Other intervertebral disc degeneration, lumbosacral region: Secondary | ICD-10-CM | POA: Diagnosis not present

## 2015-05-23 DIAGNOSIS — E785 Hyperlipidemia, unspecified: Secondary | ICD-10-CM | POA: Diagnosis not present

## 2015-05-23 DIAGNOSIS — I2510 Atherosclerotic heart disease of native coronary artery without angina pectoris: Secondary | ICD-10-CM | POA: Diagnosis not present

## 2015-05-23 NOTE — Assessment & Plan Note (Signed)
Treated

## 2015-05-23 NOTE — Assessment & Plan Note (Signed)
R/O anginal equivalent 

## 2015-05-23 NOTE — Assessment & Plan Note (Signed)
Surgery 04/11/15

## 2015-05-23 NOTE — Progress Notes (Signed)
05/23/2015 [name]   1946/05/06  [id number]  Primary Physician Joycelyn Rua, MD Primary Cardiologist: Dr Excell Seltzer  HPI:  69 y/o followed by Dr Excell Seltzer with a history of CAD, s/p prior RCA and PDA PCI's, last one was Sept 2014 (PDA) for ISR.  The pt had a Myoview for chest pain in July 2015 and this was low risk. He recently had back surgery in Aug. He mentioned to the anesthesiologist that he had DOE, fatigue, and vague chest discomfort since June. The pt tolerated his back surgery and was told this went well. He is referred to [place] for further evaluation for his complaints of DOE. The pt sates this is similar to his pre PCI symptoms of 2008.    Current Outpatient Prescriptions  Medication Sig Dispense Refill  . acetaminophen (TYLENOL) 650 MG CR tablet Take 650 mg by mouth every 8 (eight) hours as needed for pain.    Marland Kitchen aspirin 81 MG tablet Take 1 tablet (81 mg total) by mouth daily. 1 tablet 0  . clopidogrel (PLAVIX) 75 MG tablet Take 1 tablet (75 mg total) by mouth daily. 90 tablet 3  . niacin (NIASPAN) 750 MG CR tablet Take 2 tablets (1,500 mg total) by mouth at bedtime. 180 tablet 3  . nitroGLYCERIN (NITROSTAT) 0.4 MG SL tablet Place 1 tablet (0.4 mg total) under the tongue every 5 (five) minutes as needed for chest pain. 25 tablet 3  . Omega-3 Fatty Acids (OMEGA-3 FISH OIL PO) Take 1 g by mouth daily.     . simvastatin (ZOCOR) 40 MG tablet Take 0.5 tablets (20 mg total) by mouth daily. 90 tablet 3   No current facility-administered medications for this visit.    No Known Allergies  Social History   Social History  . Marital Status: Married    Spouse Name: N/A  . Number of Children: 1  . Years of Education: N/A   Occupational History  . retired Administrator, arts    Social History Main Topics  . Smoking status: Former Games developer  . Smokeless tobacco: Not on file  . Alcohol Use: No  . Drug Use: No  . Sexual Activity: Not Currently   Other Topics Concern  . Not on file    Social History Narrative     Review of Systems: General: negative for chills, fever, night sweats or weight changes.  Cardiovascular: negative for chest pain, dyspnea on exertion, edema, orthopnea, palpitations, paroxysmal nocturnal dyspnea or shortness of breath Dermatological: negative for rash Respiratory: negative for cough or wheezing Urologic: negative for hematuria Abdominal: negative for nausea, vomiting, diarrhea, bright red blood per rectum, melena, or hematemesis Neurologic: negative for visual changes, syncope, or dizziness All other systems reviewed and are otherwise negative except as noted above.    Blood pressure 130/70, pulse 73, height 6' (1.829 m), weight 160 lb (72.576 kg).  General appearance: alert, cooperative and no distress Neck: no carotid bruit and no JVD Lungs: clear to auscultation bilaterally Heart: regular rate and rhythm Extremities: no edema Pulses: 2+ and symmetric Skin: Skin color, texture, turgor normal. No rashes or lesions Neurologic: Grossly normal  EKG NSR  ASSESSMENT AND PLAN:   DOE (dyspnea on exertion) R/O anginal equivalent  CAD S/P percutaneous coronary angioplasty Multiple RCA/PDA interventions- RCA/PDA DES '05 ISR '08 and 9/14 Myoview low risk July 2015  Dyslipidemia Treated  DJD (degenerative joint disease), lumbosacral Surgery 04/11/15    PLAN  Lexiscan Myoview, f/u with Dr Excell Seltzer after this.  Corine Shelter  K PA-C 05/23/2015 11:47 AM

## 2015-05-23 NOTE — Patient Instructions (Signed)
Medication Instructions:  Your physician recommends that you continue on your current medications as directed. Please refer to the Current Medication list given to you today.   Labwork: None ordered  Testing/Procedures: Your physician has requested that you have a lexiscan myoview. For further information please visit [email or web address] Please follow instruction sheet, as given.   Follow-Up: Your physician recommends that you schedule a follow-up appointment in: 4 weeks with Dr.Cooper   Any Other Special Instructions Will Be Listed Below (If Applicable).

## 2015-05-23 NOTE — Assessment & Plan Note (Addendum)
Multiple RCA/PDA interventions- RCA/PDA DES '05 ISR '08 and 9/14 Myoview low risk July 2015

## 2015-05-28 ENCOUNTER — Telehealth (HOSPITAL_COMMUNITY): Payer: Self-pay | Admitting: *Deleted

## 2015-05-28 NOTE — Telephone Encounter (Signed)
Left message on voicemail in reference to upcoming appointment scheduled for 05/30/15. Phone number given for a call back so details instructions can be given. Hasspacher, Adelene Idler

## 2015-05-28 NOTE — Telephone Encounter (Signed)
Patient given detailed instructions per Myocardial Perfusion Study Information Sheet for test on 05/30/15 at 0915. Patient notified to arrive 15 minutes early and that it is imperative to arrive on time for appointment to keep from having the test rescheduled.  If you need to cancel or reschedule your appointment, please call the office within 24 hours of your appointment. Failure to do so may result in a cancellation of your appointment, and a $50 no show fee. Patient verbalized understanding. Melvyn Novas

## 2015-05-30 ENCOUNTER — Encounter (HOSPITAL_COMMUNITY): Payer: Medicare PPO

## 2015-05-30 ENCOUNTER — Ambulatory Visit (HOSPITAL_COMMUNITY): Payer: Medicare PPO | Attending: Cardiology

## 2015-05-30 DIAGNOSIS — R9439 Abnormal result of other cardiovascular function study: Secondary | ICD-10-CM | POA: Diagnosis not present

## 2015-05-30 DIAGNOSIS — R079 Chest pain, unspecified: Secondary | ICD-10-CM | POA: Diagnosis not present

## 2015-05-30 DIAGNOSIS — R0609 Other forms of dyspnea: Secondary | ICD-10-CM

## 2015-05-30 LAB — MYOCARDIAL PERFUSION IMAGING
LV dias vol: 87 mL
LV sys vol: 37 mL
Peak HR: 110 {beats}/min
RATE: 0.29
Rest HR: 70 {beats}/min
SDS: 3
SRS: 4
SSS: 7
TID: 0.94

## 2015-05-30 MED ORDER — REGADENOSON 0.4 MG/5ML IV SOLN
0.4000 mg | Freq: Once | INTRAVENOUS | Status: AC
  Administered 2015-05-30: 0.4 mg via INTRAVENOUS

## 2015-05-30 MED ORDER — TECHNETIUM TC 99M SESTAMIBI GENERIC - CARDIOLITE
10.2000 | Freq: Once | INTRAVENOUS | Status: AC | PRN
  Administered 2015-05-30: 10 via INTRAVENOUS

## 2015-05-30 MED ORDER — TECHNETIUM TC 99M SESTAMIBI GENERIC - CARDIOLITE
32.8000 | Freq: Once | INTRAVENOUS | Status: AC | PRN
  Administered 2015-05-30: 32.8 via INTRAVENOUS

## 2015-06-11 ENCOUNTER — Encounter: Payer: Self-pay | Admitting: Cardiovascular Disease

## 2015-06-11 ENCOUNTER — Ambulatory Visit (INDEPENDENT_AMBULATORY_CARE_PROVIDER_SITE_OTHER): Payer: Medicare PPO | Admitting: Cardiovascular Disease

## 2015-06-11 VITALS — BP 110/62 | HR 95 | Ht 72.0 in | Wt 164.4 lb

## 2015-06-11 DIAGNOSIS — I2510 Atherosclerotic heart disease of native coronary artery without angina pectoris: Secondary | ICD-10-CM

## 2015-06-11 DIAGNOSIS — E785 Hyperlipidemia, unspecified: Secondary | ICD-10-CM

## 2015-06-11 NOTE — Patient Instructions (Signed)
Medication Instructions:  Your physician recommends that you continue on your current medications as directed. Please refer to the Current Medication list given to you today.  Labwork: Your physician recommends that you return for a FASTING LIPID and LIVER in 1 YEAR--nothing to eat or drink after midnight, lab opens at 7:30 AM  Testing/Procedures: No new orders.   Follow-Up: Your physician wants you to follow-up in: 1 YEAR with Dr Cooper.  You will receive a reminder letter in the mail two months in advance. If you don't receive a letter, please call our office to schedule the follow-up appointment.   Any Other Special Instructions Will Be Listed Below (If Applicable).   

## 2015-06-11 NOTE — Progress Notes (Signed)
Cardiology Office Note Date:  06/13/2015   ID:  [name], DOB [birth year], MRN [id number]  PCP:  Joycelyn Rua, MD  Cardiologist:  Tonny Bollman, MD    Chief Complaint  Patient presents with  . Coronary Artery Disease   History of Present Illness: [name] is a 69 y.o. male who presents for follow-up of CAD.   He has coronary artery disease with history of inferior wall myocardial infarction in 2009 secondary to stent thrombosis. He was seen in August 2014 with progressive exertional dyspnea. A Myoview scan showed inferior wall infarction with peri-infarct ischemia. He was referred for cardiac catheterization which demonstrated severe in-stent restenosis in the right PDA branch. He was treated with repeat stenting. He presented to the ER in July 2015 with atypical chest pain and a nuclear stress test was completed showing inferior infarct with mild peri-infarct ischemia. Overall interpretation was low-risk.   He was recently seen by Corine Shelter for exertional dyspnea with concerns over anginal-equivalent symptoms. A Myoview scan was done and this showed inferior infarct with minimal peri-infarct ischemia and was interpreted as low-risk. His symptoms have now resolved.   He denies chest pain, chest pressure, or recurrence of shortness of breath. He has undergone back surgery and this has helped his chronic back pain. He remains limited by bilateral knee pain.  Past Medical History  Diagnosis Date  . Myocardial infarct (HCC) 09-11-06, 2009  . Coronary artery disease     s/p DES to the right PD in September 2014  . Hyperlipidemia   . Chronic neck pain     Past Surgical History  Procedure Laterality Date  . Cardiac catheterization  10-04-08  . Coronary angioplasty with stent placement  05/2013  . Cervical spine surgery    . Left heart catheterization with coronary angiogram N/A 05/11/2013    Procedure: LEFT HEART CATHETERIZATION WITH CORONARY ANGIOGRAM;  Surgeon: Micheline Chapman, MD;  Location[facility] C[facility] LAB;  Service: Cardiovascular;  Laterality: N/A;  . Lumbar laminectomy/decompression microdiscectomy Left 04/11/2015    Procedure: LUMBAR LAMINECTOMY/DECOMPRESSION MICRODISCECTOMY;  Surgeon: Estill Bamberg, MD;  Location: [facility] OR;  Service: Orthopedics;  Laterality: Left;  Left sided lumbar 4-5 microdisectomy    Current Outpatient Prescriptions  Medication Sig Dispense Refill  . acetaminophen (TYLENOL) 650 MG CR tablet Take 650 mg by mouth every 8 (eight) hours as needed for pain.    Marland Kitchen aspirin 81 MG tablet Take 1 tablet (81 mg total) by mouth daily. 1 tablet 0  . clopidogrel (PLAVIX) 75 MG tablet Take 1 tablet (75 mg total) by mouth daily. 90 tablet 3  . niacin (NIASPAN) 750 MG CR tablet Take 2 tablets (1,500 mg total) by mouth at bedtime. 180 tablet 3  . nitroGLYCERIN (NITROSTAT) 0.4 MG SL tablet Place 1 tablet (0.4 mg total) under the tongue every 5 (five) minutes as needed for chest pain. 25 tablet 3  . Omega-3 Fatty Acids (OMEGA-3 FISH OIL PO) Take 1 g by mouth daily.     . simvastatin (ZOCOR) 40 MG tablet Take 0.5 tablets (20 mg total) by mouth daily. 90 tablet 3   No current facility-administered medications for this visit.    Allergies:   Review of patient's allergies indicates no known allergies.   Social History:  The patient  reports that he has quit smoking. He does not have any smokeless tobacco history on file. He reports that he does not drink alcohol or use illicit drugs.   Family History:  The patient's  family history includes Coronary artery disease in an other family member; Heart attack in his brother; Heart attack (age of onset: 62) in his father; Heart failure in his mother. There is no history of Hypertension or Stroke.    ROS:  Please see the history of present illness.  Otherwise, review of systems is positive for  Knee pain, back pain, shortness of breath.  All other systems are reviewed and negative.    PHYSICAL EXAM: VS:  BP 110/62  mmHg  Pulse 95  Ht 6' (1.829 m)  Wt 164 lb 6.4 oz (74.571 kg)  BMI 22.29 kg/m2  SpO2 100% , BMI Body mass index is 22.29 kg/(m^2). GEN: Well nourished, well developed, in no acute distress HEENT: normal Neck: no JVD, no masses. No carotid bruits Cardiac: RRR without murmur or gallop                Respiratory:  clear to auscultation bilaterally, normal work of breathing GI: soft, nontender, nondistended, + BS MS: no deformity or atrophy Ext: no pretibial edema, pedal pulses 2+= bilaterally Skin: warm and dry, no rash Neuro:  Strength and sensation are intact Psych: euthymic mood, full affect  EKG:  EKG is not ordered today.  Recent Labs: 04/03/2015: ALT 51; BUN 7; Creatinine, Ser 0.83; Hemoglobin 14.5; Platelets 167; Potassium 4.1; Sodium 139   Lipid Panel     Component Value Date/Time   CHOL 127 09/25/2014 0830   TRIG 88.0 09/25/2014 0830   HDL 40.90 09/25/2014 0830   CHOLHDL 3 09/25/2014 0830   VLDL 17.6 09/25/2014 0830   LDLCALC 69 09/25/2014 0830      Wt Readings from Last 3 Encounters:  06/11/15 164 lb 6.4 oz (74.571 kg)  05/30/15 160 lb (72.576 kg)  05/23/15 160 lb (72.576 kg)     Cardiac Studies Reviewed: Myoview Scan 05-30-2015: Study Highlights     Nuclear stress EF: 58%.  There was no ST segment deviation noted during stress.  Defect 1: There is a small defect of moderate severity present in the basal inferior and mid inferior location.  Findings consistent with prior myocardial infarction with peri-infarct ischemia.  This is a low risk study.  The left ventricular ejection fraction is normal (55-65%).  Findings are consistent with old small inferior scar with minimal peri-infarct ischemia.  Since the prior study of 03/12/14, no significant change except mild drop in EF from 69% to 58%.     ASSESSMENT AND PLAN: 1.   CAD, native vessel , without symptoms of angina: The patient is stable on dual antiplatelet therapy after a remote history of stent  thrombosis. His recent nuclear scan was low risk and continued medical therapy is indicated. Fortunately, shortness of breath has now resolved.  2. Hyperlipidemia: most recent lipids are reviewed as above. He continues on a combination of niacin and simvastatin. LFTs have always been within normal limits. Will repeat labs prior to his next visit here.   Current medicines are reviewed with the patient today.  The patient does not have concerns regarding medicines.  Labs/ tests ordered today include:   Orders Placed This Encounter  Procedures  . Lipid panel  . Hepatic function panel    Disposition:   FU one year  Signed, Tonny Bollman, MD  06/13/2015 6:08 AM    [facility] [address] [place], [place], [place]  [address] Phone: [phone]; Fax: [phone]

## 2015-06-13 ENCOUNTER — Encounter: Payer: Self-pay | Admitting: Cardiovascular Disease

## 2015-09-09 IMAGING — CR DG CHEST 2V
2 series · 2 of 2 positions shown · non-contrast
Comparison: Single view of the chest 01/29/2010 and PA and lateral
chest 04/01/2007.

CLINICAL DATA: Shortness of breath.

EXAM:
CHEST  2 VIEW

[w chest pa]
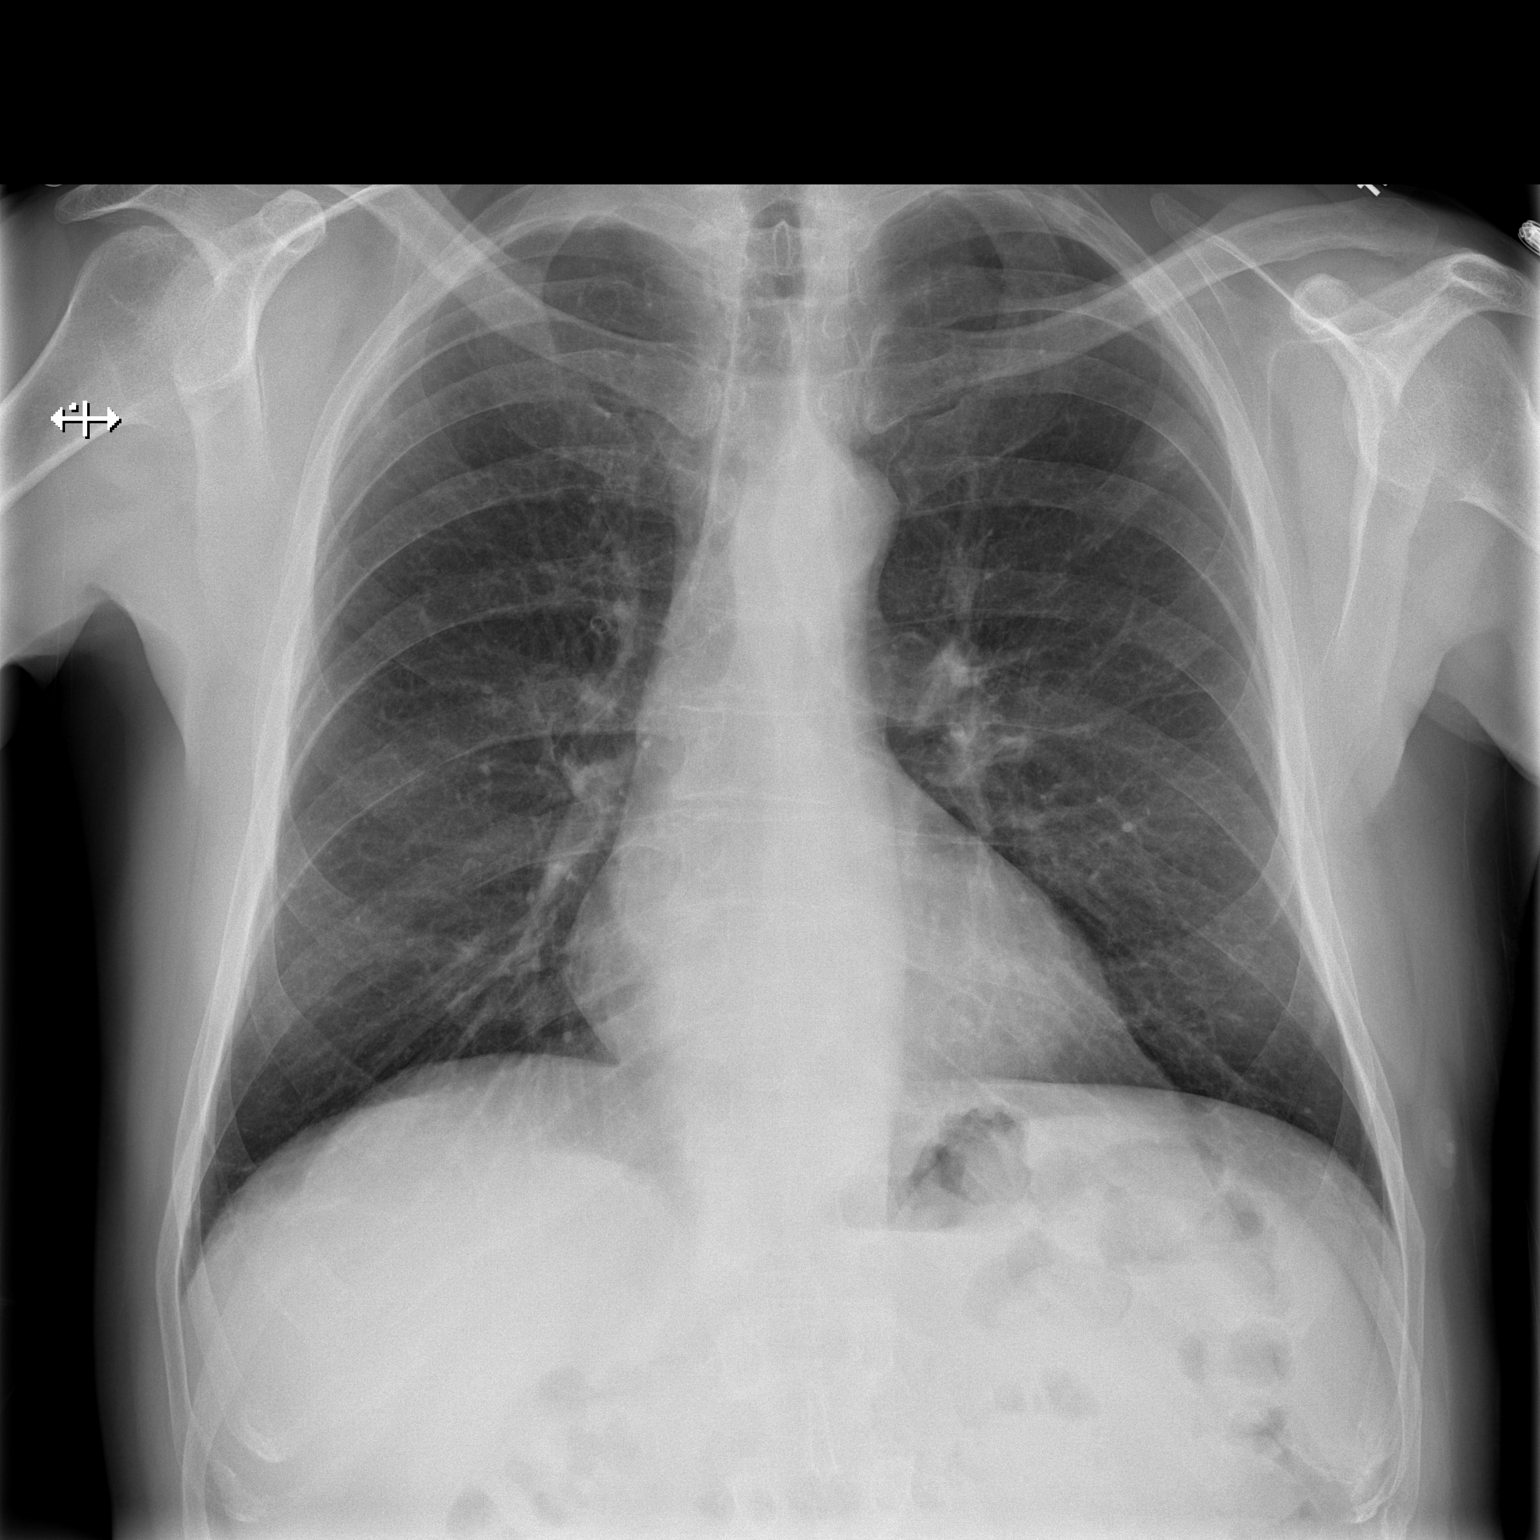

[w chest lat]
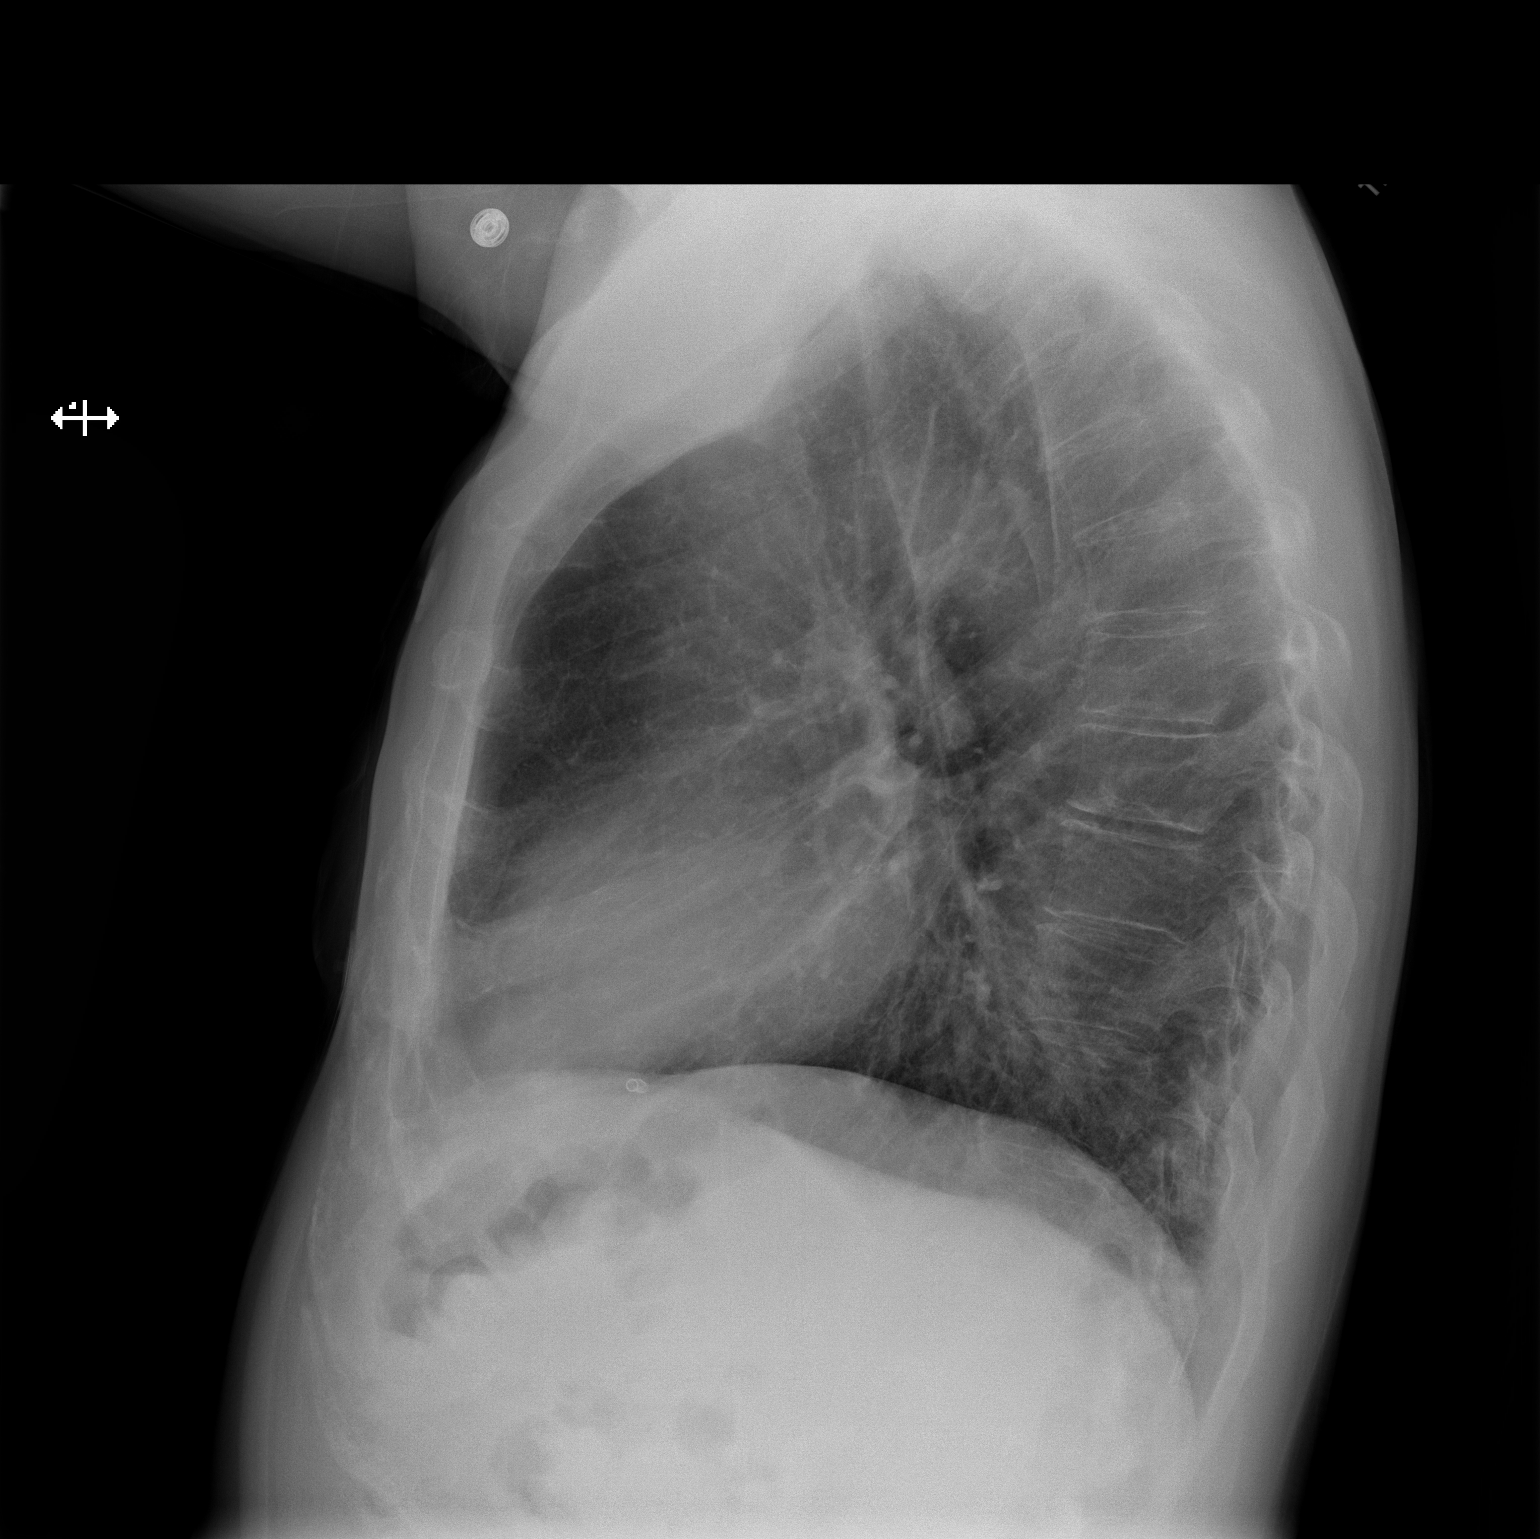

[2 of 2 positions shown; findings below may reference images not displayed]

FINDINGS: The lungs are clear. Heart size is normal. No pneumothorax or
pleural effusion.
IMPRESSION: No acute disease.

## 2015-11-11 ENCOUNTER — Other Ambulatory Visit: Payer: Self-pay | Admitting: Cardiovascular Disease

## 2015-11-25 ENCOUNTER — Other Ambulatory Visit: Payer: Self-pay | Admitting: Cardiovascular Disease

## 2015-12-15 ENCOUNTER — Other Ambulatory Visit: Payer: Self-pay | Admitting: Cardiovascular Disease

## 2016-05-07 ENCOUNTER — Other Ambulatory Visit: Payer: Self-pay | Admitting: Cardiovascular Disease

## 2016-06-10 DIAGNOSIS — M5430 Sciatica, unspecified side: Secondary | ICD-10-CM | POA: Diagnosis not present

## 2016-06-10 DIAGNOSIS — M5126 Other intervertebral disc displacement, lumbar region: Secondary | ICD-10-CM | POA: Diagnosis not present

## 2016-06-10 DIAGNOSIS — I252 Old myocardial infarction: Secondary | ICD-10-CM | POA: Diagnosis not present

## 2016-06-10 DIAGNOSIS — Z131 Encounter for screening for diabetes mellitus: Secondary | ICD-10-CM | POA: Diagnosis not present

## 2016-06-10 DIAGNOSIS — R002 Palpitations: Secondary | ICD-10-CM | POA: Diagnosis not present

## 2016-06-10 DIAGNOSIS — R200 Anesthesia of skin: Secondary | ICD-10-CM | POA: Diagnosis not present

## 2016-06-10 DIAGNOSIS — I2510 Atherosclerotic heart disease of native coronary artery without angina pectoris: Secondary | ICD-10-CM | POA: Diagnosis not present

## 2016-06-10 DIAGNOSIS — R202 Paresthesia of skin: Secondary | ICD-10-CM | POA: Diagnosis not present

## 2016-06-10 DIAGNOSIS — K5900 Constipation, unspecified: Secondary | ICD-10-CM | POA: Diagnosis not present

## 2016-06-12 ENCOUNTER — Encounter: Payer: Self-pay | Admitting: Physician Assistant

## 2016-06-12 ENCOUNTER — Ambulatory Visit (INDEPENDENT_AMBULATORY_CARE_PROVIDER_SITE_OTHER): Payer: Medicare PPO | Admitting: Physician Assistant

## 2016-06-12 VITALS — BP 130/80 | HR 70 | Ht 72.0 in | Wt 166.1 lb

## 2016-06-12 DIAGNOSIS — R079 Chest pain, unspecified: Secondary | ICD-10-CM | POA: Diagnosis not present

## 2016-06-12 DIAGNOSIS — E785 Hyperlipidemia, unspecified: Secondary | ICD-10-CM

## 2016-06-12 DIAGNOSIS — I2510 Atherosclerotic heart disease of native coronary artery without angina pectoris: Secondary | ICD-10-CM

## 2016-06-12 DIAGNOSIS — I255 Ischemic cardiomyopathy: Secondary | ICD-10-CM

## 2016-06-12 DIAGNOSIS — R739 Hyperglycemia, unspecified: Secondary | ICD-10-CM | POA: Diagnosis not present

## 2016-06-12 DIAGNOSIS — Z9861 Coronary angioplasty status: Secondary | ICD-10-CM

## 2016-06-12 NOTE — Patient Instructions (Addendum)
Medication Instructions:  Your physician recommends that you continue on your current medications as directed. Please refer to the Current Medication list given to you today.  Labwork: NONE  Testing/Procedures: Your physician has requested that you have a lexiscan myoview. For further information please visit [email or web address] Please follow instruction sheet, as given.  Follow-Up: Your physician wants you to follow-up in: 6 weeks with Dr. Excell Seltzerooper.   If you need a refill on your cardiac medications before your next appointment, please call your pharmacy.

## 2016-06-12 NOTE — Progress Notes (Addendum)
Cardiology Office Note    Date:  06/12/2016  ID:  [name], DOB [birth year], MRN [id number] PCP:  Jack Rua, MD  Cardiologist:  Dr. Excell Seltzer   Chief Complaint: f/u chest pain  History of Present Illness:  [name] is a 70 y.o. male with history of CAD (remote stenting to the RCA, inferior MI 2009 secondary to stent thrombosis s/p angioplasty to RCA, DES to PDA 05/2013), hyperlipidemia, chronic neck pain, ischemic cardiomyopathy (EF 45% in 2009, subsequently improved), diabetes mellitus who presents back for evaluation of chest pain. Last nuc was done in 05/2015 for some dyspnea which showed findings consistent with prior myocardial infarction with peri-infarct ischemia, EF 58%, no sig change from study in 02/2014, felt to be low risk. Last labs 2016 showed Cr 0.83, glu 151, Hgb 14.5.  He presents back for follow-up to discuss chest pain. For the past month or so he's noticed an intermittent stabbing/pressure sensation in his left chest and under his left axilla. It is fleeting, lasting a few seconds, occurring a couple times a day - he'll tell his wife "oh, there goes another one!" He does not feel any overt palpitations. There is on rhyme or reason to it occurring. He has been plagued by chronic back problems and recently diagnosed peripheral neuropathy so is not very active. He's unsure of any exertional component. No SOB, nausea, diaphoresis, or syncope. He does get chronic dizziness upon standing, unchanged lately - this is why he is not on a BB. Denies any falls or syncope. Although pain is atypical he does say it kind of reminds him of prior angina except the pain he had with his MI was like an ice pick stabbing his back. His PCP just did labs the other day and officially diagnosed him with diabetes. The plan is to start metformin.   Past Medical History:  Diagnosis Date  . CAD in native artery    a. remote stenting to the RCA. b. inferior MI 2009 secondary to stent thrombosis s/p  angioplasty to RCA. c. DES to PDA 05/2013.  Marland Kitchen Chronic neck pain   . Diabetes mellitus (HCC)   . Hyperlipidemia   . Ischemic cardiomyopathy    a. EF 45% in 2009 at time of MI. b. Subsequent EFs normal.  . Myocardial infarct 09-11-06, 2009    Past Surgical History:  Procedure Laterality Date  . CARDIAC CATHETERIZATION  10-04-08  . CERVICAL SPINE SURGERY    . CORONARY ANGIOPLASTY WITH STENT PLACEMENT  05/2013  . LEFT HEART CATHETERIZATION WITH CORONARY ANGIOGRAM N/A 05/11/2013   Procedure: LEFT HEART CATHETERIZATION WITH CORONARY ANGIOGRAM;  Surgeon: Jack Chapman, MD;  Location: [facility] CATH LAB;  Service: Cardiovascular;  Laterality: N/A;  . LUMBAR LAMINECTOMY/DECOMPRESSION MICRODISCECTOMY Left 04/11/2015   Procedure: LUMBAR LAMINECTOMY/DECOMPRESSION MICRODISCECTOMY;  Surgeon: Jack Bamberg, MD;  Location: [facility] OR;  Service: Orthopedics;  Laterality: Left;  Left sided lumbar 4-5 microdisectomy    Current Medications: Current Outpatient Prescriptions  Medication Sig Dispense Refill  . acetaminophen (TYLENOL) 650 MG CR tablet Take 650 mg by mouth every 8 (eight) hours as needed for pain.    Marland Kitchen aspirin 81 MG tablet Take 1 tablet (81 mg total) by mouth daily. 1 tablet 0  . clopidogrel (PLAVIX) 75 MG tablet Take 1 tablet (75 mg total) by mouth daily. 90 tablet 3  . niacin (NIASPAN) 750 MG CR tablet Take 750 mg by mouth at bedtime.    . nitroGLYCERIN (NITROSTAT) 0.4 MG SL tablet Place  1 tablet (0.4 mg total) under the tongue every 5 (five) minutes as needed for chest pain. 25 tablet 3  . Omega-3 Fatty Acids (OMEGA-3 FISH OIL PO) Take 1 g by mouth daily.     . simvastatin (ZOCOR) 40 MG tablet TAKE ONE-HALF TABLET BY MOUTH ONCE DAILY 45 tablet 1   No current facility-administered medications for this visit.      Allergies:   Review of patient's allergies indicates no known allergies.   Social History   Social History  . Marital status: Married    Spouse name: N/A  . Number of children: 1  .  Years of education: N/A   Occupational History  . retired Administrator, arts Retired   Social History Main Topics  . Smoking status: Former Games developer  . Smokeless tobacco: None  . Alcohol use No  . Drug use: No  . Sexual activity: Not Currently   Other Topics Concern  . None   Social History Narrative  . None     Family History:  The patient's family history includes Heart attack in his brother; Heart attack (age of onset: 67) in his father; Heart failure in his mother.   ROS:   Please see the history of present illness.All other systems are reviewed and otherwise negative.    PHYSICAL EXAM:   VS:  BP 130/80   Pulse 70   Ht 6' (1.829 m)   Wt 166 lb 1.9 oz (75.4 kg)   SpO2 99%   BMI 22.53 kg/m   BMI: Body mass index is 22.53 kg/m. GEN: Well nourished, well developed thin WM, in no acute distress  HEENT: normocephalic, atraumatic Neck: no JVD, carotid bruits, or masses Cardiac: RRR; no murmurs, rubs, or gallops, no edema  Respiratory:  clear to auscultation bilaterally, normal work of breathing GI: soft, nontender, nondistended, + BS MS: no deformity or atrophy  Skin: warm and dry, no rash Neuro:  Alert and Oriented x 3, Strength and sensation are intact, follows commands Psych: euthymic mood, full affect  Wt Readings from Last 3 Encounters:  06/12/16 166 lb 1.9 oz (75.4 kg)  06/11/15 164 lb 6.4 oz (74.6 kg)  05/30/15 160 lb (72.6 kg)      Studies/Labs Reviewed:   EKG:  EKG was ordered today and personally reviewed by me and demonstrates NSR 70bpm, prior inferior infarct, nonspecific St-T changes. No sig changes from prior.   Recent Labs: No results found for requested labs within last 8760 hours.   Lipid Panel    Component Value Date/Time   CHOL 127 09/25/2014 0830   TRIG 88.0 09/25/2014 0830   HDL 40.90 09/25/2014 0830   CHOLHDL 3 09/25/2014 0830   VLDL 17.6 09/25/2014 0830   LDLCALC 69 09/25/2014 0830    Additional studies/ records that were reviewed  today include: Summarized above.    ASSESSMENT & PLAN:   1. Chest pain - grossly atypical, EKG unchanged. However, he does report h/o atypical angina in the past. Will proceed with Lexiscan nuclear stress test. If changed from prior, will need LHC to further evaluate. He reports symptoms c/w orthostasis, thus I've chosen not to add any empiric antiaginals today. No falls. Warning sx reviewed with patient.  2. CAD s/p PCI as above - continue aspirin, Plavix, statin. See below re: statin. 3. Dyslipidemia - Will see if we can get a copy of labs from [facility] PCP. If recent LFTs are OK, would consider changing his Zocor to Lipitor given his CAD/DM. He denies  any issues with other statins in the past. He says he's only taking Niacin once per day due to cost. I'll plan to review this with Dr. Excell Seltzerooper next time I see him as there have been studies bringing into question clinical utility of niacin. 4. Hyperglycemia - has officially been diagnosed with DM. PCP plans to start metformin. 5. Ischemic cardiomyopathy - most recent EF by nuc normal. F/u EF by updated nuc. No sx to suggest CHF.   Disposition: F/u with me or Dr. Excell Seltzerooper in 6 weeks.   Medication Adjustments/Labs and Tests Ordered: Current medicines are reviewed at length with the patient today.  Concerns regarding medicines are outlined above. Medication changes, Labs and Tests ordered today are summarized above and listed in the Patient Instructions accessible in Encounters.   Thomasene MohairSigned, Keghan Mcfarren PA-C  06/12/2016 1:37 PM    [facility] [address], [place], [place]  [address] Phone: [phone]; Fax: [phone]

## 2016-06-15 ENCOUNTER — Other Ambulatory Visit: Payer: Self-pay | Admitting: Cardiovascular Disease

## 2016-06-23 ENCOUNTER — Telehealth (HOSPITAL_COMMUNITY): Payer: Self-pay | Admitting: *Deleted

## 2016-06-23 NOTE — Telephone Encounter (Signed)
Left message on voicemail per DPR in reference to upcoming appointment scheduled on 06/15/16 at 0945. with detailed instructions given per Myocardial Perfusion Study Information Sheet for the test. LM to arrive 15 minutes early, and that it is imperative to arrive on time for appointment to keep from having the test rescheduled. If you need to cancel or reschedule your appointment, please call the office within 24 hours of your appointment. Failure to do so may result in a cancellation of your appointment, and a $50 no show fee. Phone number given for call back for any questions.

## 2016-06-25 ENCOUNTER — Ambulatory Visit (HOSPITAL_COMMUNITY): Payer: Medicare PPO | Attending: Cardiology

## 2016-06-25 DIAGNOSIS — I2510 Atherosclerotic heart disease of native coronary artery without angina pectoris: Secondary | ICD-10-CM | POA: Diagnosis not present

## 2016-06-25 DIAGNOSIS — I255 Ischemic cardiomyopathy: Secondary | ICD-10-CM | POA: Diagnosis not present

## 2016-06-25 DIAGNOSIS — I253 Aneurysm of heart: Secondary | ICD-10-CM | POA: Insufficient documentation

## 2016-06-25 DIAGNOSIS — R931 Abnormal findings on diagnostic imaging of heart and coronary circulation: Secondary | ICD-10-CM | POA: Insufficient documentation

## 2016-06-25 DIAGNOSIS — R079 Chest pain, unspecified: Secondary | ICD-10-CM | POA: Diagnosis not present

## 2016-06-25 DIAGNOSIS — Z9861 Coronary angioplasty status: Secondary | ICD-10-CM | POA: Diagnosis not present

## 2016-06-25 LAB — MYOCARDIAL PERFUSION IMAGING
CHL CUP NUCLEAR SSS: 4
LVDIAVOL: 96 mL (ref 62–150)
LVSYSVOL: 45 mL
Peak HR: 94 {beats}/min
RATE: 0.28
Rest HR: 62 {beats}/min
SDS: 2
SRS: 2
TID: 1.04

## 2016-06-25 MED ORDER — REGADENOSON 0.4 MG/5ML IV SOLN
0.4000 mg | Freq: Once | INTRAVENOUS | Status: AC
  Administered 2016-06-25: 0.4 mg via INTRAVENOUS

## 2016-06-25 MED ORDER — TECHNETIUM TC 99M TETROFOSMIN IV KIT
10.4000 | PACK | Freq: Once | INTRAVENOUS | Status: AC | PRN
  Administered 2016-06-25: 10.4 via INTRAVENOUS
  Filled 2016-06-25: qty 11

## 2016-06-25 MED ORDER — TECHNETIUM TC 99M TETROFOSMIN IV KIT
33.0000 | PACK | Freq: Once | INTRAVENOUS | Status: AC | PRN
  Administered 2016-06-25: 33 via INTRAVENOUS
  Filled 2016-06-25: qty 33

## 2016-06-30 ENCOUNTER — Encounter: Payer: Self-pay | Admitting: Dietician

## 2016-06-30 ENCOUNTER — Encounter: Payer: Medicare PPO | Attending: Family Medicine | Admitting: Dietician

## 2016-06-30 DIAGNOSIS — E1159 Type 2 diabetes mellitus with other circulatory complications: Secondary | ICD-10-CM | POA: Insufficient documentation

## 2016-06-30 DIAGNOSIS — Z713 Dietary counseling and surveillance: Secondary | ICD-10-CM | POA: Insufficient documentation

## 2016-06-30 DIAGNOSIS — E119 Type 2 diabetes mellitus without complications: Secondary | ICD-10-CM

## 2016-06-30 NOTE — Progress Notes (Signed)
Patient was seen on 06/30/16 for the first of a series of three diabetes self-management courses at the [facility].  Patient Education Plan per assessed needs and concerns is to attend four course education program for Diabetes Self Management Education.  The following learning objectives were met by the patient during this class:  Describe diabetes  State some common risk factors for diabetes  Defines the role of glucose and insulin  Identifies type of diabetes and pathophysiology  Describe the relationship between diabetes and cardiovascular risk  State the members of the Healthcare Team  States the rationale for glucose monitoring  State when to test glucose  State their individual Target Range  State the importance of logging glucose readings  Describe how to interpret glucose readings  Identifies A1C target  Explain the correlation between A1c and eAG values  State symptoms and treatment of high blood glucose  State symptoms and treatment of low blood glucose  Explain proper technique for glucose testing  Identifies proper sharps disposal  Handouts given during class include:  Living Well with Diabetes book  Carb Counting and Meal Planning book  Meal Plan Card  Carbohydrate guide  Meal planning worksheet  Low Sodium Flavoring Tips  The diabetes portion plate  Q5Z to eAG Conversion Chart  Diabetes Medications  Diabetes Recommended Care Schedule  Support Group  Diabetes Success Plan  Core Class Satisfaction Survey  Follow-Up Plan:  Attend core 2

## 2016-07-06 ENCOUNTER — Encounter: Payer: Self-pay | Admitting: Cardiovascular Disease

## 2016-07-06 ENCOUNTER — Ambulatory Visit (INDEPENDENT_AMBULATORY_CARE_PROVIDER_SITE_OTHER): Payer: Medicare PPO | Admitting: Cardiovascular Disease

## 2016-07-06 VITALS — BP 126/60 | HR 70 | Ht 72.0 in | Wt 160.1 lb

## 2016-07-06 DIAGNOSIS — E785 Hyperlipidemia, unspecified: Secondary | ICD-10-CM | POA: Diagnosis not present

## 2016-07-06 DIAGNOSIS — I255 Ischemic cardiomyopathy: Secondary | ICD-10-CM | POA: Diagnosis not present

## 2016-07-06 NOTE — Patient Instructions (Signed)

## 2016-07-06 NOTE — Progress Notes (Signed)
Cardiology Office Note Date:  07/06/2016   ID:  Jack ModeJohn R Alvarez, DOB 04/25/1946, MRN [id number]  PCP:  [name], STEPHEN, MD  Cardiologist:  [name], Jack Furney, MD    Chief Complaint  Patient presents with  . Coronary Artery Disease     History of Present Illness: [name] is a 70 y.o. male who presents for follow-up of coronary artery disease and old MI. The patient had undergone initial stenting of a totally occluded right coronary artery. The patient then presented in 2009 with an acute inferior MI secondary to very late stent thrombosis. He underwent repeat stenting in 2014 when he presented with progressive exertional dyspnea. He was seen in the office in October with chest pain and a myocardial perfusion scan was ordered. This demonstrated prior inferior infarction without significant ischemia. He returns today for further evaluation.  Patient brings in lab work dated 06/10/2016 demonstrating hemoglobin A1c of 6.8, hemoglobin 14.6, platelet count 255,000, creatinine 1.02, glucose 96, potassium 5.2, AST 20, ALT 37, and TSH 3.6.  The patient's chest pain symptoms have resolved. They were fairly atypical as he describes a sharp stabbing pain in the left chest that just lasted for a few seconds. They would occur intermittently for about 30 minutes. There was no associated exertional symptoms. The patient denies dyspnea, edema, or heart palpitations. He has not been as active as in the past because of back and hip problems.   Past Medical History:  Diagnosis Date  . CAD in native artery    a. remote stenting to the RCA. b. inferior MI 2009 secondary to stent thrombosis s/p angioplasty to RCA. c. DES to PDA 05/2013.  Marland Kitchen. Chronic neck pain   . Diabetes mellitus (HCC)   . Hyperlipidemia   . Ischemic cardiomyopathy    a. EF 45% in 2009 at time of MI. b. Subsequent EFs normal.  . Myocardial infarct 09-11-06, 2009  . Orthostatic hypotension     Past Surgical History:  Procedure Laterality  Date  . CARDIAC CATHETERIZATION  10-04-08  . CERVICAL SPINE SURGERY    . CORONARY ANGIOPLASTY WITH STENT PLACEMENT  05/2013  . LEFT HEART CATHETERIZATION WITH CORONARY ANGIOGRAM N/A 05/11/2013   Procedure: LEFT HEART CATHETERIZATION WITH CORONARY ANGIOGRAM;  Surgeon: Jack ChapmanMichael D Jasan Doughtie, MD;  [facility];  Service: Cardiovascular;  Laterality: N/A;  . LUMBAR LAMINECTOMY/DECOMPRESSION MICRODISCECTOMY Left 04/11/2015   Procedure: LUMBAR LAMINECTOMY/DECOMPRESSION MICRODISCECTOMY;  Surgeon: Estill BambergMark Dumonski, MD;  Location: [facility] OR;  Service: Orthopedics;  Laterality: Left;  Left sided lumbar 4-5 microdisectomy    Current Outpatient Prescriptions  Medication Sig Dispense Refill  . acetaminophen (TYLENOL) 650 MG CR tablet Take 650 mg by mouth every 8 (eight) hours as needed for pain.    Marland Kitchen. aspirin 81 MG tablet Take 1 tablet (81 mg total) by mouth daily. 1 tablet 0  . clopidogrel (PLAVIX) 75 MG tablet Take 1 tablet (75 mg total) by mouth daily. 90 tablet 3  . metFORMIN (GLUCOPHAGE) 500 MG tablet Take by mouth 2 (two) times daily with a meal.    . niacin (NIASPAN) 750 MG CR tablet Take 750 mg by mouth at bedtime.    . nitroGLYCERIN (NITROSTAT) 0.4 MG SL tablet Place 1 tablet (0.4 mg total) under the tongue every 5 (five) minutes as needed for chest pain. 25 tablet 3  . Omega-3 Fatty Acids (OMEGA-3 FISH OIL PO) Take 2.4 g by mouth daily.     . simvastatin (ZOCOR) 40 MG tablet TAKE ONE-HALF TABLET BY  MOUTH ONCE DAILY 45 tablet 3   No current facility-administered medications for this visit.     Allergies:   Beta adrenergic blockers   Social History:  The patient  reports that he has quit smoking. He has never used smokeless tobacco. He reports that he does not drink alcohol or use drugs.   Family History:  The patient's  family history includes Heart attack in his brother; Heart attack (age of onset: 7563) in his father; Heart failure in his mother.    ROS:  Please see the history of present illness.   Otherwise, review of systems is positive for Chest pain, back pain, dizziness, easy bruising, irregular heartbeats, constipation.  All other systems are reviewed and negative.    PHYSICAL EXAM: VS:  BP 126/60   Pulse 70   Ht 6' (1.829 m)   Wt 72.6 kg (160 lb 1.9 oz)   SpO2 99%   BMI 21.72 kg/m  , BMI Body mass index is 21.72 kg/m. GEN: Well nourished, well developed, in no acute distress  HEENT: normal  Neck: no JVD, no masses. No carotid bruits Cardiac: RRR without murmur or gallop                Respiratory:  clear to auscultation bilaterally, normal work of breathing GI: soft, nontender, nondistended, + BS MS: no deformity or atrophy  Ext: no pretibial edema, pedal pulses 2+= bilaterally Skin: warm and dry, no rash Neuro:  Strength and sensation are intact Psych: euthymic mood, full affect  EKG:  EKG is not ordered today.  Recent Labs: No results found for requested labs within last 8760 hours.   Lipid Panel     Component Value Date/Time   CHOL 127 09/25/2014 0830   TRIG 88.0 09/25/2014 0830   HDL 40.90 09/25/2014 0830   CHOLHDL 3 09/25/2014 0830   VLDL 17.6 09/25/2014 0830   LDLCALC 69 09/25/2014 0830      Wt Readings from Last 3 Encounters:  07/06/16 72.6 kg (160 lb 1.9 oz)  06/30/16 73.9 kg (163 lb)  06/25/16 75.3 kg (166 lb)     Cardiac Studies Reviewed: Myocardial perfusion study 06/25/2016: Study Highlights     Nuclear stress EF: 53%.  There was no ST segment deviation noted during stress.  Defect 1: There is a large defect of moderate severity present in the basal inferior, mid inferior and apical inferior location.  This is a low risk study.  The left ventricular ejection fraction is mildly decreased (45-54%).   Low risk stress nuclear study with significant diaphragmatic attenuation vs prior inferior MI and minimal inferior ischemia; EF 53 with mild global hypokinesis.      ASSESSMENT AND PLAN: 1.  CAD, native vessel, with atypical  angina: Chest pain symptoms have resolved. His nuclear scan is reviewed without significant ischemia. The fixed defect in the inferior wall is consistent with his known history of an old inferior MI. Have recommended that he continue his current medical program without changes. He remains on long-term dual antiplatelet therapy with a history of stent thrombosis.  2. Postural dizziness: The patient describes symptoms of dizziness when he first stands up. We discussed salt intake and increased fluids. He is very strict about no salt and I advised that he should liberalize this.  3. Hyperlipidemia: Treated with simvastatin.  4. Type 2 diabetes: Last reviewed as above. Hemoglobin A1c is 6.8. He has adjusted his diet and has started metformin 500 mg twice daily which he seems to  be tolerating well. Treated by his primary care physician.  Current medicines are reviewed with the patient today.  The patient does not have concerns regarding medicines.  Labs/ tests ordered today include:  No orders of the defined types were placed in this encounter.   Disposition:   FU one year  Signed, Jack Bollman, MD  07/06/2016 8:12 AM    [facility] [address] [place], [place], [place]  [address] Phone: [phone]; Fax: [phone]

## 2016-07-07 ENCOUNTER — Encounter: Payer: Medicare PPO | Attending: Family Medicine | Admitting: Dietician

## 2016-07-07 DIAGNOSIS — E119 Type 2 diabetes mellitus without complications: Secondary | ICD-10-CM

## 2016-07-07 DIAGNOSIS — E1159 Type 2 diabetes mellitus with other circulatory complications: Secondary | ICD-10-CM | POA: Insufficient documentation

## 2016-07-07 DIAGNOSIS — Z713 Dietary counseling and surveillance: Secondary | ICD-10-CM | POA: Diagnosis not present

## 2016-07-07 NOTE — Progress Notes (Signed)

## 2016-07-09 DIAGNOSIS — E7800 Pure hypercholesterolemia, unspecified: Secondary | ICD-10-CM | POA: Diagnosis not present

## 2016-07-09 DIAGNOSIS — E119 Type 2 diabetes mellitus without complications: Secondary | ICD-10-CM | POA: Diagnosis not present

## 2016-07-09 DIAGNOSIS — I700 Atherosclerosis of aorta: Secondary | ICD-10-CM | POA: Diagnosis not present

## 2016-07-09 DIAGNOSIS — Z1211 Encounter for screening for malignant neoplasm of colon: Secondary | ICD-10-CM | POA: Diagnosis not present

## 2016-07-14 ENCOUNTER — Encounter: Payer: Self-pay | Admitting: Skilled Nursing Facility1

## 2016-07-14 ENCOUNTER — Encounter: Payer: Medicare PPO | Admitting: Skilled Nursing Facility1

## 2016-07-14 DIAGNOSIS — Z713 Dietary counseling and surveillance: Secondary | ICD-10-CM | POA: Diagnosis not present

## 2016-07-14 DIAGNOSIS — E119 Type 2 diabetes mellitus without complications: Secondary | ICD-10-CM

## 2016-07-14 DIAGNOSIS — E1159 Type 2 diabetes mellitus with other circulatory complications: Secondary | ICD-10-CM | POA: Diagnosis not present

## 2016-07-14 NOTE — Progress Notes (Signed)
Patient was seen on 07/14/2016 for the third of a series of three diabetes self-management courses at the [facility]. The following learning objectives were met by the patient during this class:  . State the amount of activity recommended for healthy living . Describe activities suitable for individual needs . Identify ways to regularly incorporate activity into daily life . Identify barriers to activity and ways to over come these barriers  Identify diabetes medications being personally used and their primary action for lowering glucose and possible side effects . Describe role of stress on blood glucose and develop strategies to address psychosocial issues . Identify diabetes complications and ways to prevent them  Explain how to manage diabetes during illness . Evaluate success in meeting personal goal . Establish 2-3 goals that they will plan to diligently work on until they return for the  42-monthfollow-up visit  Goals:   I will count my carb choices at most meals and snacks  I will take my diabetes medications as scheduled  I will eat less unhealthy fats   Your patient has identified their diabetes self-care support plan as  [facility] Support [facility]:  Attend Monthly Diabetes Support Group as needed or make a future follow up appointment

## 2016-08-10 ENCOUNTER — Other Ambulatory Visit: Payer: Self-pay | Admitting: Cardiovascular Disease

## 2016-09-09 DIAGNOSIS — Z79899 Other long term (current) drug therapy: Secondary | ICD-10-CM | POA: Diagnosis not present

## 2016-09-09 DIAGNOSIS — M25512 Pain in left shoulder: Secondary | ICD-10-CM | POA: Diagnosis not present

## 2016-09-09 DIAGNOSIS — E7800 Pure hypercholesterolemia, unspecified: Secondary | ICD-10-CM | POA: Diagnosis not present

## 2016-09-09 DIAGNOSIS — M79676 Pain in unspecified toe(s): Secondary | ICD-10-CM | POA: Diagnosis not present

## 2016-09-09 DIAGNOSIS — I700 Atherosclerosis of aorta: Secondary | ICD-10-CM | POA: Diagnosis not present

## 2016-09-09 DIAGNOSIS — E119 Type 2 diabetes mellitus without complications: Secondary | ICD-10-CM | POA: Diagnosis not present

## 2016-09-23 DIAGNOSIS — K5730 Diverticulosis of large intestine without perforation or abscess without bleeding: Secondary | ICD-10-CM | POA: Diagnosis not present

## 2016-09-23 DIAGNOSIS — Z1211 Encounter for screening for malignant neoplasm of colon: Secondary | ICD-10-CM | POA: Diagnosis not present

## 2016-10-12 IMAGING — CR DG LUMBAR SPINE 2-3V
2 series · 2 of 2 positions shown · non-contrast
Comparison: MR lumbar spine of 03/16/2015

CLINICAL DATA: For lumbar laminectomy at L4-5, for localization

EXAM:
LUMBAR SPINE - 2-3 VIEW

[lateral (1 of 2)]
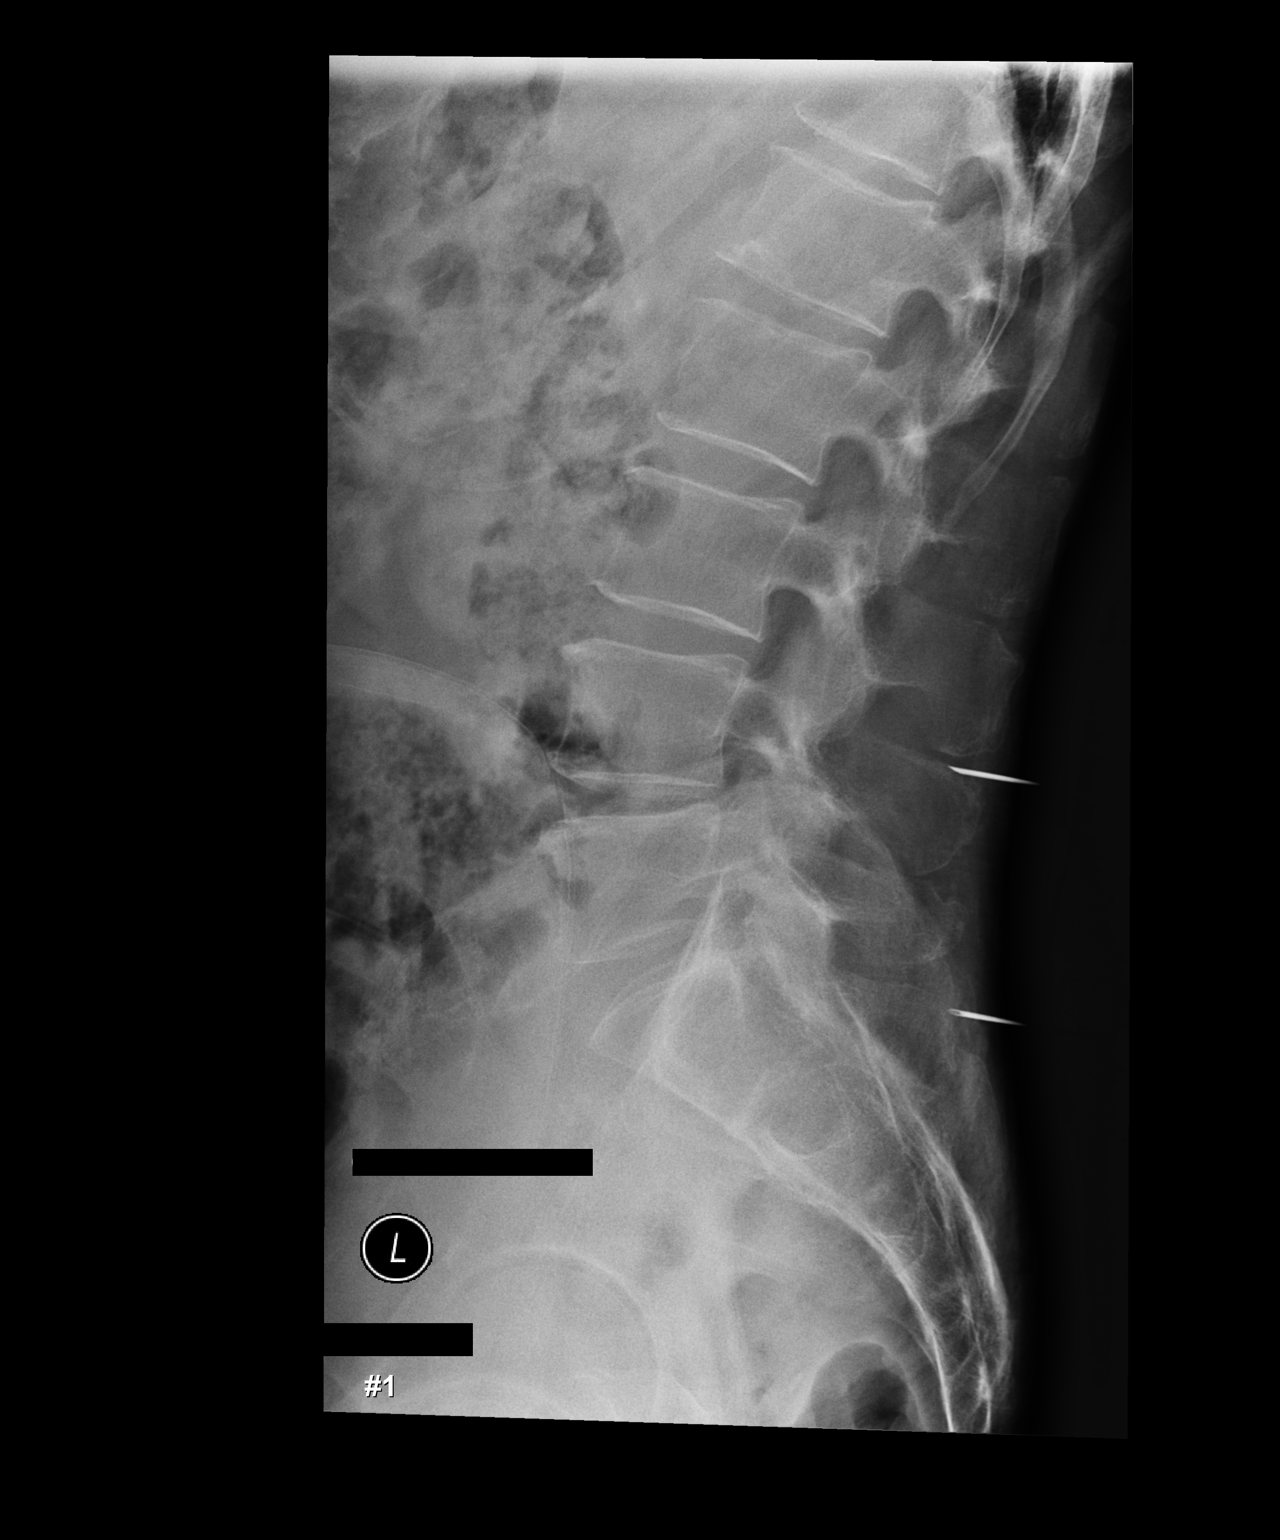

[lateral (2 of 2)]
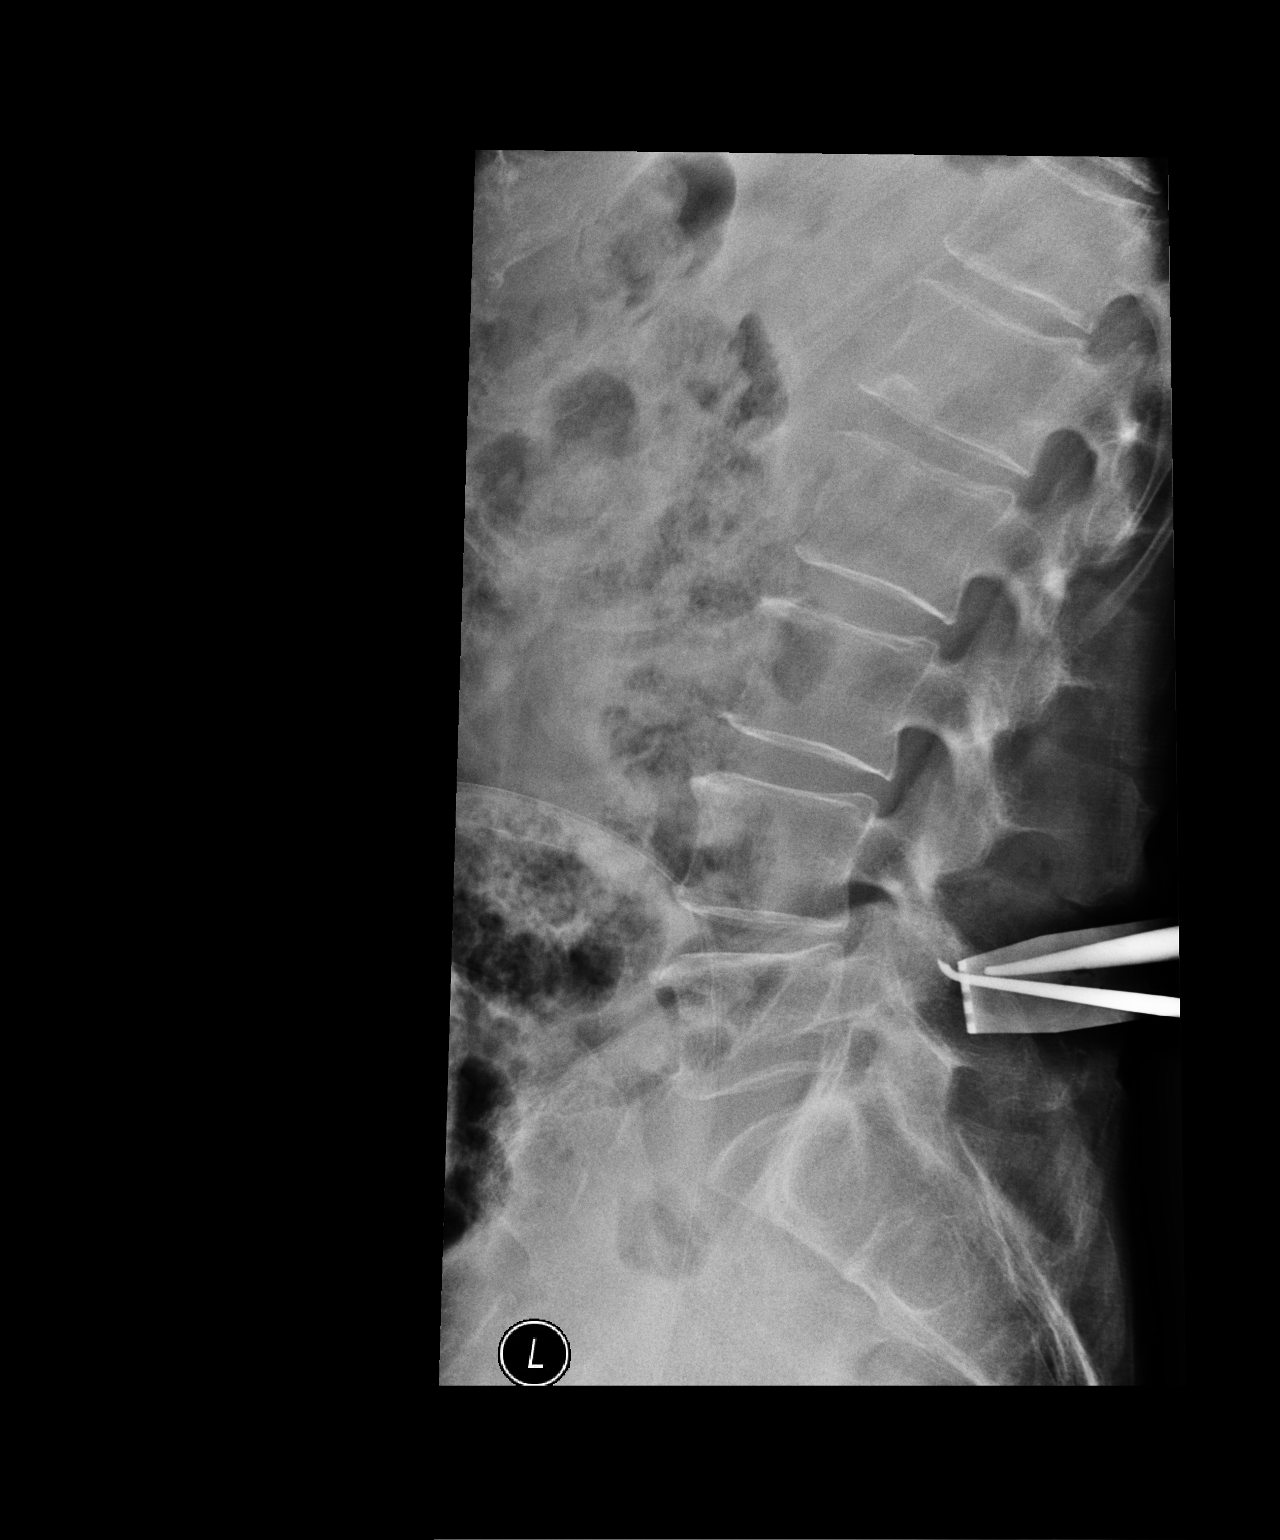

[2 of 2 positions shown; findings below may reference images not displayed]

FINDINGS: The more cephalad needle lies between the spinous processes
posteriorly of L3 and L4, with the more caudal needle just beneath
the spinous process posteriorly of L5.
IMPRESSION: Needles located between the spinous process of L3 and L4 and beneath
the spinous process of L5.

## 2016-10-27 DIAGNOSIS — M7542 Impingement syndrome of left shoulder: Secondary | ICD-10-CM | POA: Diagnosis not present

## 2016-11-25 DIAGNOSIS — M7542 Impingement syndrome of left shoulder: Secondary | ICD-10-CM | POA: Diagnosis not present

## 2016-11-30 ENCOUNTER — Other Ambulatory Visit: Payer: Self-pay | Admitting: Cardiovascular Disease

## 2016-12-01 ENCOUNTER — Other Ambulatory Visit: Payer: Self-pay | Admitting: *Deleted

## 2016-12-01 MED ORDER — NIACIN ER (ANTIHYPERLIPIDEMIC) 750 MG PO TBCR: 750.0000 mg | EXTENDED_RELEASE_TABLET | Freq: Every day | ORAL | 1 refills | Status: DC

## 2016-12-01 NOTE — Telephone Encounter (Signed)
Medication Detail    Disp Refills Start End   niacin (NIASPAN) 750 MG CR tablet 90 tablet 1 12/01/2016    Sig - Route: Take 1 tablet (750 mg total) by mouth at bedtime. - Oral   E-Prescribing Status: Receipt confirmed by pharmacy (12/01/2016 10:44 AM EDT)   Pharmacy   [facility] - [place], [place] - [address]

## 2016-12-03 ENCOUNTER — Telehealth: Payer: Self-pay | Admitting: Cardiovascular Disease

## 2016-12-03 NOTE — Telephone Encounter (Signed)
Follow up     Pt is waiting at the pharmacy

## 2016-12-03 NOTE — Telephone Encounter (Signed)
Spoke with pharmacist at [facility].  She would like to fill niacin 750 mg for timed release instead of controlled release for much lower price.  Per Pharmacist controlled release and timed release tablets are the same kind of release.  Advised it would be ok to fill niacin 750 mg timed release tablets.

## 2016-12-03 NOTE — Telephone Encounter (Signed)
New message      Pharmacist has a question about the niacin  presc

## 2016-12-09 DIAGNOSIS — M7542 Impingement syndrome of left shoulder: Secondary | ICD-10-CM | POA: Diagnosis not present

## 2016-12-14 DIAGNOSIS — M7542 Impingement syndrome of left shoulder: Secondary | ICD-10-CM | POA: Diagnosis not present

## 2016-12-25 DIAGNOSIS — M24112 Other articular cartilage disorders, left shoulder: Secondary | ICD-10-CM | POA: Diagnosis not present

## 2016-12-25 DIAGNOSIS — S46092A Other injury of muscle(s) and tendon(s) of the rotator cuff of left shoulder, initial encounter: Secondary | ICD-10-CM | POA: Diagnosis not present

## 2016-12-25 DIAGNOSIS — M19012 Primary osteoarthritis, left shoulder: Secondary | ICD-10-CM | POA: Diagnosis not present

## 2016-12-25 DIAGNOSIS — G8918 Other acute postprocedural pain: Secondary | ICD-10-CM | POA: Diagnosis not present

## 2016-12-25 DIAGNOSIS — S43432A Superior glenoid labrum lesion of left shoulder, initial encounter: Secondary | ICD-10-CM | POA: Diagnosis not present

## 2016-12-25 DIAGNOSIS — M7542 Impingement syndrome of left shoulder: Secondary | ICD-10-CM | POA: Diagnosis not present

## 2016-12-25 DIAGNOSIS — R69 Illness, unspecified: Secondary | ICD-10-CM | POA: Diagnosis not present

## 2016-12-25 DIAGNOSIS — M75112 Incomplete rotator cuff tear or rupture of left shoulder, not specified as traumatic: Secondary | ICD-10-CM | POA: Diagnosis not present

## 2017-01-08 ENCOUNTER — Ambulatory Visit (HOSPITAL_BASED_OUTPATIENT_CLINIC_OR_DEPARTMENT_OTHER)
Admission: RE | Admit: 2017-01-08 | Discharge: 2017-01-08 | Disposition: A | Discharge status: Home / Self Care | Payer: Medicare PPO | Source: Ambulatory Visit | Attending: Internal Medicine | Admitting: Internal Medicine

## 2017-01-08 ENCOUNTER — Other Ambulatory Visit: Payer: Self-pay | Admitting: Internal Medicine

## 2017-01-08 DIAGNOSIS — M79661 Pain in right lower leg: Secondary | ICD-10-CM | POA: Insufficient documentation

## 2017-01-08 DIAGNOSIS — R52 Pain, unspecified: Secondary | ICD-10-CM

## 2017-01-08 DIAGNOSIS — R0602 Shortness of breath: Secondary | ICD-10-CM | POA: Diagnosis not present

## 2017-01-08 DIAGNOSIS — M7989 Other specified soft tissue disorders: Secondary | ICD-10-CM | POA: Diagnosis not present

## 2017-01-08 DIAGNOSIS — E1159 Type 2 diabetes mellitus with other circulatory complications: Secondary | ICD-10-CM | POA: Diagnosis not present

## 2017-01-08 DIAGNOSIS — I2510 Atherosclerotic heart disease of native coronary artery without angina pectoris: Secondary | ICD-10-CM | POA: Diagnosis not present

## 2017-01-08 DIAGNOSIS — R609 Edema, unspecified: Secondary | ICD-10-CM | POA: Diagnosis not present

## 2017-01-08 DIAGNOSIS — Z7984 Long term (current) use of oral hypoglycemic drugs: Secondary | ICD-10-CM | POA: Diagnosis not present

## 2017-02-10 ENCOUNTER — Ambulatory Visit: Payer: Medicare PPO | Attending: Orthopedic Surgery | Admitting: Physical Therapy

## 2017-02-10 ENCOUNTER — Encounter: Payer: Self-pay | Admitting: Physical Therapy

## 2017-02-10 DIAGNOSIS — M25512 Pain in left shoulder: Secondary | ICD-10-CM | POA: Insufficient documentation

## 2017-02-10 DIAGNOSIS — M6281 Muscle weakness (generalized): Secondary | ICD-10-CM | POA: Diagnosis not present

## 2017-02-10 DIAGNOSIS — G8929 Other chronic pain: Secondary | ICD-10-CM | POA: Diagnosis not present

## 2017-02-10 DIAGNOSIS — M25612 Stiffness of left shoulder, not elsewhere classified: Secondary | ICD-10-CM

## 2017-02-10 NOTE — Therapy (Signed)
[facility] [address] [place], [place], [address] Phone: [phone]   Fax:  [phone]  Physical Therapy Evaluation  Patient Details  Name: [name] MRN: [id number] Date of Birth: Jul 26, 1946 Referring Provider: Francena Hanly MD  Encounter Date: 02/10/2017      PT End of Session - 02/10/17 1005    Visit Number 1   Number of Visits 12   Date for PT Re-Evaluation 03/10/17   PT Start Time 0950      Past Medical History:  Diagnosis Date  . CAD in native artery    a. remote stenting to the RCA. b. inferior MI 2009 secondary to stent thrombosis s/p angioplasty to RCA. c. DES to PDA 05/2013.  Marland Kitchen Chronic neck pain   . Diabetes mellitus (HCC)   . Hyperlipidemia   . Ischemic cardiomyopathy    a. EF 45% in 2009 at time of MI. b. Subsequent EFs normal.  . Myocardial infarct (HCC) 09-11-06, 2009  . Orthostatic hypotension     Past Surgical History:  Procedure Laterality Date  . CARDIAC CATHETERIZATION  10-04-08  . CERVICAL SPINE SURGERY    . CORONARY ANGIOPLASTY WITH STENT PLACEMENT  05/2013  . LEFT HEART CATHETERIZATION WITH CORONARY ANGIOGRAM N/A 05/11/2013   Procedure: LEFT HEART CATHETERIZATION WITH CORONARY ANGIOGRAM;  Surgeon: Micheline Chapman, MD;  Location: [facility] CATH LAB;  Service: Cardiovascular;  Laterality: N/A;  . LUMBAR LAMINECTOMY/DECOMPRESSION MICRODISCECTOMY Left 04/11/2015   Procedure: LUMBAR LAMINECTOMY/DECOMPRESSION MICRODISCECTOMY;  Surgeon: Estill Bamberg, MD;  Location: [facility] OR;  Service: Orthopedics;  Laterality: Left;  Left sided lumbar 4-5 microdisectomy    There were no vitals filed for this visit.       Subjective Assessment - 02/10/17 1007    Subjective The patient underwent a left shoulder arthroscopic surgery with RTC repair.  He states he was doing great until earlier this week he had an increase in pain.  His resting pain-level is a 4/10 but can rise to 5-6+/10 with certain left shoulder movements.   Medications decrease his pain.   Pertinent History Severe left shoulder injury and surgery as a youyh.   Patient Stated Goals Use my left arm without pain.   Currently in Pain? Yes   Pain Score 4    Pain Location Shoulder   Pain Orientation Left   Pain Descriptors / Indicators Aching;Sharp   Pain Type Surgical pain   Pain Onset More than a month ago   Pain Frequency Constant   Aggravating Factors  See above.   Pain Relieving Factors See above.            [facility] PT Assessment - 02/10/17 0001      Assessment   Medical Diagnosis Left shoulder arthroscopic surgery; RTC repair.   Referring Provider Francena Hanly MD   Onset Date/Surgical Date --  12/25/16 (surgery date).     Precautions   Precautions --  See Standard RTC repair protocol [facility].     Restrictions   Weight Bearing Restrictions No     Balance Screen   Has the patient fallen in the past 6 months No   Has the patient had a decrease in activity level because of a fear of falling?  No   Is the patient reluctant to leave their home because of a fear of falling?  No     Home Tourist information centre manager residence     Prior Function   Level of Independence Independent  Observation/Other Assessments   Observations Left shoulder incisional sites are healing well.  "Popeye muscle" left Bicep.   Focus on Therapeutic Outcomes (FOTO)  38% limitation.     Posture/Postural Control   Posture/Postural Control Postural limitations   Postural Limitations Rounded Shoulders;Forward head     ROM / Strength   AROM / PROM / Strength AROM;Strength     AROM   Overall AROM Comments Active left shoulder flexion= 102 degrees; ER= 68 degrees.     Strength   Overall Strength Comments Deferred left shoulder strength testing due to recent surgical intervention.     Palpation   Palpation comment Tender at acromial ridge and referred pain into left midle deltoid region.     Ambulation/Gait   Gait Comments  WNL.            Objective measurements completed on examination: See above findings.          [facility] Adult PT Treatment/Exercise - 02/10/17 0001      Modalities   Modalities Electrical Stimulation;Vasopneumatic     Electrical Stimulation   Electrical Stimulation Location Left shoulder.   Electrical Stimulation Action IFC   Electrical Stimulation Parameters 80-150 Hz on 100% scan...non-motoric x 20 minutes.   Electrical Stimulation Goals Pain     Vasopneumatic   Number Minutes Vasopneumatic  20 minutes   Vasopnuematic Location  --  Left shoulder.   Vasopneumatic Pressure Medium                  PT Short Term Goals - 02/10/17 1051      PT SHORT TERM GOAL #1   Title ndependent with an initial HEP.   Time 3   Period Weeks   Status New     PT SHORT TERM GOAL #2   Title Restore full left shoulder PROM.   Time 3   Period Weeks   Status New           PT Long Term Goals - 02/10/17 1052      PT LONG TERM GOAL #1   Title Independent with an advanced HEP.   Time 8   Period Weeks   Status New     PT LONG TERM GOAL #2   Title Active left shoulder flexion to 145 degrees so the patient can easily reach overhead   Time 8   Period Weeks   Status New     PT LONG TERM GOAL #3   Title Increase ROM so patient is able to reach behind back to L3 with left hand.   Time 8   Period Weeks   Status New     PT LONG TERM GOAL #4   Title Increase left shoulder strength to a solid 4+/5 to increase stability for performance of functional activities   Time 8   Period Weeks   Status New     PT LONG TERM GOAL #5   Title Perform ADL's with pain not > 2/10.   Time 8   Period Weeks   Status New                Plan - 02/10/17 1046    Clinical Impression Statement The patient presents to OPPT s/p left shoulder surgery with RTC performed on 12/25/16.  He has limited range of motion and increased pain with certain left shoulder movements  He has a "popeye"  muscle on the left.  His current deficits limit him from performing ADL's.  Patient will benefit from skilled  physical therapy.   History and Personal Factors relevant to plan of care: Previous left shoulder surgery.  Cervical surgery.   Clinical Presentation Stable   Clinical Decision Making Low   Rehab Potential Good   PT Frequency 3x / week   PT Duration 4 weeks   PT Treatment/Interventions ADLs/Self Care Home Management;Cryotherapy;Electrical Stimulation;Moist Heat;Ultrasound;Therapeutic activities;Therapeutic exercise;Neuromuscular re-education;Patient/family education;Passive range of motion;Vasopneumatic Device   PT Next Visit Plan Please refer to standard RTC protocol...[facility].  Can begin pulleys and UE Ranger; PROM to improve left shoulder flexion and ER.  Modalites PRN for pain control.   Consulted and Agree with Plan of Care Patient      Patient will benefit from skilled therapeutic intervention in order to improve the following deficits and impairments:  Decreased activity tolerance, Decreased range of motion, Pain  Visit Diagnosis: Chronic left shoulder pain - Plan: PT plan of care cert/re-cert  Stiffness of left shoulder, not elsewhere classified - Plan: PT plan of care cert/re-cert  Muscle weakness (generalized) - Plan: PT plan of care cert/re-cert      G-Codes - 02/10/17 1055    Functional Assessment Tool Used (Outpatient Only) FOTO...38% limitation.   Functional Limitation Self care   Self Care Current Status [phone](G8987) At least 20 percent but less than 40 percent impaired, limited or restricted   Self Care Goal Status ([id number]([id number]) At least 1 percent but less than 20 percent impaired, limited or restricted       Problem List Patient Active Problem List   Diagnosis Date Noted  . Hyperglycemia 06/12/2016  . Cardiomyopathy, ischemic 06/12/2016  . DOE (dyspnea on exertion) 05/23/2015  . DJD (degenerative joint disease), lumbosacral 05/23/2015  . Angina, class III  (HCC) 05/12/2013  . Dyslipidemia 04/29/2009  . CAD S/P percutaneous coronary angioplasty 04/29/2009    Seymore Brodowski, [place] MPT 02/10/2017, 11:01 AM  [facility] [address] [place], [place], [address] Phone: [phone]   Fax:  [phone]  Name: [name] MRN: [id number] Date of Birth: 01/05/1946

## 2017-02-12 ENCOUNTER — Ambulatory Visit: Payer: Medicare PPO | Admitting: *Deleted

## 2017-02-12 DIAGNOSIS — M25512 Pain in left shoulder: Principal | ICD-10-CM

## 2017-02-12 DIAGNOSIS — M25612 Stiffness of left shoulder, not elsewhere classified: Secondary | ICD-10-CM

## 2017-02-12 DIAGNOSIS — G8929 Other chronic pain: Secondary | ICD-10-CM | POA: Diagnosis not present

## 2017-02-12 DIAGNOSIS — M6281 Muscle weakness (generalized): Secondary | ICD-10-CM | POA: Diagnosis not present

## 2017-02-12 NOTE — Therapy (Signed)
[facility] [address] [place], [place], [address] Phone: [phone]   Fax:  [phone]  Physical Therapy Treatment  Patient Details  Name: [name] MRN: [id number] Date of Birth: 09/30/1945 Referring Provider: Francena Hanly MD  Encounter Date: 02/12/2017      PT End of Session - 02/12/17 1119    Visit Number 2   Number of Visits 12   Date for PT Re-Evaluation 03/10/17   PT Start Time 1115   PT Stop Time 1208   PT Time Calculation (min) 53 min      Past Medical History:  Diagnosis Date  . CAD in native artery    a. remote stenting to the RCA. b. inferior MI 2009 secondary to stent thrombosis s/p angioplasty to RCA. c. DES to PDA 05/2013.  Marland Kitchen Chronic neck pain   . Diabetes mellitus (HCC)   . Hyperlipidemia   . Ischemic cardiomyopathy    a. EF 45% in 2009 at time of MI. b. Subsequent EFs normal.  . Myocardial infarct (HCC) 09-11-06, 2009  . Orthostatic hypotension     Past Surgical History:  Procedure Laterality Date  . CARDIAC CATHETERIZATION  10-04-08  . CERVICAL SPINE SURGERY    . CORONARY ANGIOPLASTY WITH STENT PLACEMENT  05/2013  . LEFT HEART CATHETERIZATION WITH CORONARY ANGIOGRAM N/A 05/11/2013   Procedure: LEFT HEART CATHETERIZATION WITH CORONARY ANGIOGRAM;  Surgeon: Micheline Chapman, MD;  Location: [facility] CATH LAB;  Service: Cardiovascular;  Laterality: N/A;  . LUMBAR LAMINECTOMY/DECOMPRESSION MICRODISCECTOMY Left 04/11/2015   Procedure: LUMBAR LAMINECTOMY/DECOMPRESSION MICRODISCECTOMY;  Surgeon: Estill Bamberg, MD;  Location: [facility] OR;  Service: Orthopedics;  Laterality: Left;  Left sided lumbar 4-5 microdisectomy    There were no vitals filed for this visit.      Subjective Assessment - 02/12/17 1118    Subjective The patient underwent a left shoulder arthroscopic surgery with RTC repair.  He states he was doing great until earlier this week he had an increase in pain.  His resting pain-level is a 4/10 but can rise to  5-6+/10 with certain left shoulder movements.  Medications decrease his pain.   Pertinent History Severe left shoulder injury and surgery as a youyh.   Patient Stated Goals Use my left arm without pain.   Currently in Pain? Yes   Pain Score 4    Pain Location Shoulder   Pain Orientation Right   Pain Type Surgical pain   Pain Onset More than a month ago                         [facility] Adult PT Treatment/Exercise - 02/12/17 0001      Exercises   Exercises Shoulder     Shoulder Exercises: Pulleys   Flexion --   5 mins flexion   Other Pulley Exercises UE ranger seated flex/ext, circles each way x 3 mins and standing  UE ranger x 3 mins flexion and circles each way     Modalities   Modalities Electrical Stimulation;Vasopneumatic     Electrical Stimulation   Electrical Stimulation Location Left shoulder.  IFC 80-150hz  x 15 mins   Electrical Stimulation Goals Pain     Vasopneumatic   Number Minutes Vasopneumatic  15 minutes   Vasopnuematic Location  --  Left shoulder.   Vasopneumatic Pressure Medium   Vasopneumatic Temperature  36     Manual Therapy   Manual Therapy Passive ROM   Passive ROM PROM to LT shldr  flexion, ER, and IR with end-range holds                  PT Short Term Goals - 02/10/17 1051      PT SHORT TERM GOAL #1   Title ndependent with an initial HEP.   Time 3   Period Weeks   Status New     PT SHORT TERM GOAL #2   Title Restore full left shoulder PROM.   Time 3   Period Weeks   Status New           PT Long Term Goals - 02/10/17 1052      PT LONG TERM GOAL #1   Title Independent with an advanced HEP.   Time 8   Period Weeks   Status New     PT LONG TERM GOAL #2   Title Active left shoulder flexion to 145 degrees so the patient can easily reach overhead   Time 8   Period Weeks   Status New     PT LONG TERM GOAL #3   Title Increase ROM so patient is able to reach behind back to L3 with left hand.   Time 8    Period Weeks   Status New     PT LONG TERM GOAL #4   Title Increase left shoulder strength to a solid 4+/5 to increase stability for performance of functional activities   Time 8   Period Weeks   Status New     PT LONG TERM GOAL #5   Title Perform ADL's with pain not > 2/10.   Time 8   Period Weeks   Status New               Plan - 02/12/17 1122    Clinical Impression Statement Pt arrived to clinic today doing fairly well, but with complaints of constant pain since last week for no obvious reason. He did well with AAROM therex and manual PROM. Flexion to 130 degrees, ER to 70, IR to 90 degrees   Clinical Decision Making Low   Rehab Potential Good   PT Frequency 3x / week   PT Duration 4 weeks   PT Treatment/Interventions ADLs/Self Care Home Management;Cryotherapy;Electrical Stimulation;Moist Heat;Ultrasound;Therapeutic activities;Therapeutic exercise;Neuromuscular re-education;Patient/family education;Passive range of motion;Vasopneumatic Device   PT Next Visit Plan Please refer to standard RTC protocol...[place] [facility].  Can begin pulleys and UE Ranger; PROM to improve left shoulder flexion and ER.  Modalites PRN for pain control.   Consulted and Agree with Plan of Care Patient      Patient will benefit from skilled therapeutic intervention in order to improve the following deficits and impairments:  Decreased activity tolerance, Decreased range of motion, Pain  Visit Diagnosis: Chronic left shoulder pain  Stiffness of left shoulder, not elsewhere classified  Muscle weakness (generalized)     Problem List Patient Active Problem List   Diagnosis Date Noted  . Hyperglycemia 06/12/2016  . Cardiomyopathy, ischemic 06/12/2016  . DOE (dyspnea on exertion) 05/23/2015  . DJD (degenerative joint disease), lumbosacral 05/23/2015  . Angina, class III (HCC) 05/12/2013  . Dyslipidemia 04/29/2009  . CAD S/P percutaneous coronary angioplasty 04/29/2009    Demetria Lightsey,CHRIS,  PTA 02/12/2017, 12:28 PM  [facility] [address] [place], [place], [address] Phone: [phone]   Fax:  [phone]  Name: Jack ModeJohn R Alvarez MRN: [id number] Date of Birth: 02/17/1946

## 2017-02-15 ENCOUNTER — Encounter: Payer: Self-pay | Admitting: Physical Therapy

## 2017-02-15 ENCOUNTER — Ambulatory Visit: Payer: Medicare PPO | Admitting: Physical Therapy

## 2017-02-15 DIAGNOSIS — M25512 Pain in left shoulder: Secondary | ICD-10-CM | POA: Diagnosis not present

## 2017-02-15 DIAGNOSIS — M6281 Muscle weakness (generalized): Secondary | ICD-10-CM | POA: Diagnosis not present

## 2017-02-15 DIAGNOSIS — M25612 Stiffness of left shoulder, not elsewhere classified: Secondary | ICD-10-CM | POA: Diagnosis not present

## 2017-02-15 DIAGNOSIS — G8929 Other chronic pain: Secondary | ICD-10-CM | POA: Diagnosis not present

## 2017-02-15 NOTE — Therapy (Signed)
[facility] [address] [place], [place], [address] Phone: [phone]   Fax:  [phone]  Physical Therapy Treatment  Patient Details  Name: [name] MRN: [id number] Date of Birth: Nov 11, 1945 Referring Provider: Francena Hanly MD  Encounter Date: 02/15/2017      PT End of Session - 02/15/17 1023    Visit Number 3   Number of Visits 12   Date for PT Re-Evaluation 03/10/17   PT Start Time 0946   PT Stop Time 1037   PT Time Calculation (min) 51 min   Activity Tolerance Patient tolerated treatment well   Behavior During Therapy [facility] for tasks assessed/performed      Past Medical History:  Diagnosis Date  . CAD in native artery    a. remote stenting to the RCA. b. inferior MI 2009 secondary to stent thrombosis s/p angioplasty to RCA. c. DES to PDA 05/2013.  Marland Kitchen Chronic neck pain   . Diabetes mellitus (HCC)   . Hyperlipidemia   . Ischemic cardiomyopathy    a. EF 45% in 2009 at time of MI. b. Subsequent EFs normal.  . Myocardial infarct (HCC) 09-11-06, 2009  . Orthostatic hypotension     Past Surgical History:  Procedure Laterality Date  . CARDIAC CATHETERIZATION  10-04-08  . CERVICAL SPINE SURGERY    . CORONARY ANGIOPLASTY WITH STENT PLACEMENT  05/2013  . LEFT HEART CATHETERIZATION WITH CORONARY ANGIOGRAM N/A 05/11/2013   Procedure: LEFT HEART CATHETERIZATION WITH CORONARY ANGIOGRAM;  Surgeon: Micheline Chapman, MD;  Location: [facility] CATH LAB;  Service: Cardiovascular;  Laterality: N/A;  . LUMBAR LAMINECTOMY/DECOMPRESSION MICRODISCECTOMY Left 04/11/2015   Procedure: LUMBAR LAMINECTOMY/DECOMPRESSION MICRODISCECTOMY;  Surgeon: Estill Bamberg, MD;  Location: [facility] OR;  Service: Orthopedics;  Laterality: Left;  Left sided lumbar 4-5 microdisectomy    There were no vitals filed for this visit.      Subjective Assessment - 02/15/17 0948    Subjective Patient reported increased stiffness over weekend   Pertinent History Severe left shoulder  injury and surgery as a youyh.   Patient Stated Goals Use my left arm without pain.   Currently in Pain? Yes   Pain Score 4    Pain Location Shoulder   Pain Orientation Right   Pain Descriptors / Indicators Aching;Tightness   Pain Onset More than a month ago   Pain Frequency Constant   Aggravating Factors  certain movements   Pain Relieving Factors at rest            [facility] PT Assessment - 02/15/17 0001      ROM / Strength   AROM / PROM / Strength AROM;PROM     PROM   PROM Assessment Site Shoulder   Right/Left Shoulder Left   Left Shoulder Flexion 136 Degrees   Left Shoulder External Rotation 73 Degrees                     [facility] Adult PT Treatment/Exercise - 02/15/17 0001      Shoulder Exercises: Pulleys   Flexion Other (comment)    Other Pulley Exercises UE ranger for elevation and circles 2x10 each way     Electrical Stimulation   Electrical Stimulation Location Left shoulder.  IFC 80-150hz  x 15 mins   Electrical Stimulation Goals Pain     Vasopneumatic   Number Minutes Vasopneumatic  15 minutes   Vasopnuematic Location  Shoulder   Vasopneumatic Pressure Medium     Manual Therapy   Manual Therapy Passive  ROM   Passive ROM PROM to LT shldr flexion, ER, and IR with end-range holds, rhythmic stabs for IR/ER in scaption and flexion at 90 degrees                  PT Short Term Goals - 02/15/17 1025      PT SHORT TERM GOAL #1   Title independent with an initial HEP.   Time 3   Period Weeks   Status On-going     PT SHORT TERM GOAL #2   Title Restore full left shoulder PROM.   Time 3   Period Weeks   Status On-going           PT Long Term Goals - 02/15/17 1025      PT LONG TERM GOAL #1   Title Independent with an advanced HEP.   Time 8   Period Weeks   Status On-going     PT LONG TERM GOAL #2   Title Active left shoulder flexion to 145 degrees so the patient can easily reach overhead   Time 8   Period Weeks    Status On-going     PT LONG TERM GOAL #3   Title Increase ROM so patient is able to reach behind back to L3 with left hand.   Time 8   Period Weeks   Status On-going     PT LONG TERM GOAL #4   Title Increase left shoulder strength to a solid 4+/5 to increase stability for performance of functional activities   Time 8   Period Weeks   Status On-going     PT LONG TERM GOAL #5   Title Perform ADL's with pain not > 2/10.   Time 8   Period Weeks   Status On-going               Plan - 02/15/17 1025    Clinical Impression Statement Patient tolerted treatment well todya with no reported pain increase. Patient has improved with ROM for left shoulder flexion and ER. Patient reported using arm for ADL's such as yard and house work. Educated patient on protocol and healing to avoid re-injury. Patient current goals ongoing due to protocol and healing limitations.   Rehab Potential Good   Clinical Impairments Affecting Rehab Potential surgery 12/25/16 current 7 weeks 02/12/17   PT Frequency 3x / week   PT Duration 4 weeks   PT Treatment/Interventions ADLs/Self Care Home Management;Cryotherapy;Electrical Stimulation;Moist Heat;Ultrasound;Therapeutic activities;Therapeutic exercise;Neuromuscular re-education;Patient/family education;Passive range of motion;Vasopneumatic Device   PT Next Visit Plan Please refer to standard RTC protocol...[place] [facility].  Can begin pulleys and UE Ranger; PROM to improve left shoulder flexion and ER.  Modalites PRN for pain control. Supple 03/02/17   Consulted and Agree with Plan of Care Patient      Patient will benefit from skilled therapeutic intervention in order to improve the following deficits and impairments:  Decreased activity tolerance, Decreased range of motion, Pain  Visit Diagnosis: Chronic left shoulder pain  Stiffness of left shoulder, not elsewhere classified  Muscle weakness (generalized)     Problem List Patient Active Problem List    Diagnosis Date Noted  . Hyperglycemia 06/12/2016  . Cardiomyopathy, ischemic 06/12/2016  . DOE (dyspnea on exertion) 05/23/2015  . DJD (degenerative joint disease), lumbosacral 05/23/2015  . Angina, class III (HCC) 05/12/2013  . Dyslipidemia 04/29/2009  . CAD S/P percutaneous coronary angioplasty 04/29/2009    Tahmir Kleckner P, PTA 02/15/2017, 10:40 AM  [facility] [address] [place], [place], [address] Phone: [phone]   Fax:  [phone]  Name: Woodroe ModeJohn R Kindel MRN: [id number] Date of Birth: 10/30/1945

## 2017-02-18 ENCOUNTER — Ambulatory Visit: Payer: Medicare PPO | Admitting: Physical Therapy

## 2017-02-18 ENCOUNTER — Encounter: Payer: Self-pay | Admitting: Physical Therapy

## 2017-02-18 DIAGNOSIS — G8929 Other chronic pain: Secondary | ICD-10-CM | POA: Diagnosis not present

## 2017-02-18 DIAGNOSIS — M25612 Stiffness of left shoulder, not elsewhere classified: Secondary | ICD-10-CM | POA: Diagnosis not present

## 2017-02-18 DIAGNOSIS — M25512 Pain in left shoulder: Secondary | ICD-10-CM | POA: Diagnosis not present

## 2017-02-18 DIAGNOSIS — M6281 Muscle weakness (generalized): Secondary | ICD-10-CM | POA: Diagnosis not present

## 2017-02-18 NOTE — Therapy (Signed)
[facility] [address] [place], [place], [address] Phone: [phone]   Fax:  [phone]  Physical Therapy Treatment  Patient Details  Name: [name] MRN: [id number] Date of Birth: 1946-07-14 Referring Provider: Francena Hanly MD  Encounter Date: 02/18/2017      PT End of Session - 02/18/17 1020    Visit Number 4   Number of Visits 12   Date for PT Re-Evaluation 03/10/17   PT Start Time 0945   PT Stop Time 1035   PT Time Calculation (min) 50 min   Activity Tolerance Patient tolerated treatment well   Behavior During Therapy [facility] for tasks assessed/performed      Past Medical History:  Diagnosis Date  . CAD in native artery    a. remote stenting to the RCA. b. inferior MI 2009 secondary to stent thrombosis s/p angioplasty to RCA. c. DES to PDA 05/2013.  Marland Kitchen Chronic neck pain   . Diabetes mellitus (HCC)   . Hyperlipidemia   . Ischemic cardiomyopathy    a. EF 45% in 2009 at time of MI. b. Subsequent EFs normal.  . Myocardial infarct (HCC) 09-11-06, 2009  . Orthostatic hypotension     Past Surgical History:  Procedure Laterality Date  . CARDIAC CATHETERIZATION  10-04-08  . CERVICAL SPINE SURGERY    . CORONARY ANGIOPLASTY WITH STENT PLACEMENT  05/2013  . LEFT HEART CATHETERIZATION WITH CORONARY ANGIOGRAM N/A 05/11/2013   Procedure: LEFT HEART CATHETERIZATION WITH CORONARY ANGIOGRAM;  Surgeon: Micheline Chapman, MD;  Location: [facility] CATH LAB;  Service: Cardiovascular;  Laterality: N/A;  . LUMBAR LAMINECTOMY/DECOMPRESSION MICRODISCECTOMY Left 04/11/2015   Procedure: LUMBAR LAMINECTOMY/DECOMPRESSION MICRODISCECTOMY;  Surgeon: Estill Bamberg, MD;  Location: [facility] OR;  Service: Orthopedics;  Laterality: Left;  Left sided lumbar 4-5 microdisectomy    There were no vitals filed for this visit.      Subjective Assessment - 02/18/17 0948    Subjective Patient has reported increased pain, he carried a case of water (about 40 bottles)   Pertinent History Severe left shoulder injury and surgery as a youyh.   Patient Stated Goals Use my left arm without pain.   Currently in Pain? Yes   Pain Score 4    Pain Location Shoulder   Pain Orientation Right   Pain Descriptors / Indicators Aching   Pain Onset More than a month ago   Pain Frequency Constant   Aggravating Factors  certain movements   Pain Relieving Factors at rest                         [facility] Adult PT Treatment/Exercise - 02/18/17 0001      Shoulder Exercises: Pulleys   Flexion Other (comment)    Other Pulley Exercises UE ranger for elevation and circles 2x10 each way     Electrical Stimulation   Electrical Stimulation Location Left shoulder.  IFC 80-150hz  x 15 mins   Electrical Stimulation Goals Pain     Vasopneumatic   Number Minutes Vasopneumatic  15 minutes   Vasopnuematic Location  Shoulder   Vasopneumatic Pressure Medium     Manual Therapy   Manual Therapy Passive ROM   Passive ROM PROM to LT shldr flexion, ER, and IR with end-range holds, rhythmic stabs for IR/ER in scaption and flexion at 90 degrees                  PT Short Term Goals - 02/15/17 1025  PT SHORT TERM GOAL #1   Title independent with an initial HEP.   Time 3   Period Weeks   Status On-going     PT SHORT TERM GOAL #2   Title Restore full left shoulder PROM.   Time 3   Period Weeks   Status On-going           PT Long Term Goals - 02/15/17 1025      PT LONG TERM GOAL #1   Title Independent with an advanced HEP.   Time 8   Period Weeks   Status On-going     PT LONG TERM GOAL #2   Title Active left shoulder flexion to 145 degrees so the patient can easily reach overhead   Time 8   Period Weeks   Status On-going     PT LONG TERM GOAL #3   Title Increase ROM so patient is able to reach behind back to L3 with left hand.   Time 8   Period Weeks   Status On-going     PT LONG TERM GOAL #4   Title Increase left shoulder  strength to a solid 4+/5 to increase stability for performance of functional activities   Time 8   Period Weeks   Status On-going     PT LONG TERM GOAL #5   Title Perform ADL's with pain not > 2/10.   Time 8   Period Weeks   Status On-going               Plan - 02/18/17 1021    Clinical Impression Statement Patient tolerated treatment without pain complaints. Patient has ongoing soreness and reported doing too much at home for example he lifeted a case of water he bought from [employer] per reported. Educated patient on protocol/healing to avoid re-injury. Patient progressing with ROM per protocol today. Current goals ongoing.   Rehab Potential Good   Clinical Impairments Affecting Rehab Potential surgery 12/25/16 current 7 weeks 02/12/17   PT Frequency 3x / week   PT Duration 4 weeks   PT Treatment/Interventions ADLs/Self Care Home Management;Cryotherapy;Electrical Stimulation;Moist Heat;Ultrasound;Therapeutic activities;Therapeutic exercise;Neuromuscular re-education;Patient/family education;Passive range of motion;Vasopneumatic Device   PT Next Visit Plan Please refer to standard RTC protocol...[place] [facility].  Can begin pulleys and UE Ranger; PROM to improve left shoulder flexion and ER.  Modalites PRN for pain control. Supple 03/02/17   Consulted and Agree with Plan of Care Patient      Patient will benefit from skilled therapeutic intervention in order to improve the following deficits and impairments:  Decreased activity tolerance, Decreased range of motion, Pain  Visit Diagnosis: Chronic left shoulder pain  Stiffness of left shoulder, not elsewhere classified  Muscle weakness (generalized)     Problem List Patient Active Problem List   Diagnosis Date Noted  . Hyperglycemia 06/12/2016  . Cardiomyopathy, ischemic 06/12/2016  . DOE (dyspnea on exertion) 05/23/2015  . DJD (degenerative joint disease), lumbosacral 05/23/2015  . Angina, class III (HCC) 05/12/2013  .  Dyslipidemia 04/29/2009  . CAD S/P percutaneous coronary angioplasty 04/29/2009    Hermelinda DellenDUNFORD, Janece Laidlaw P, PTA 02/18/2017, 10:39 AM  [facility] [address] [place], [place], [address] Phone: [phone]   Fax:  [phone]  Name: Woodroe ModeJohn R Mccole MRN: [id number] Date of Birth: 06/23/1946

## 2017-02-22 ENCOUNTER — Ambulatory Visit: Payer: Medicare PPO | Admitting: Physical Therapy

## 2017-02-22 ENCOUNTER — Encounter: Payer: Self-pay | Admitting: Physical Therapy

## 2017-02-22 DIAGNOSIS — M25612 Stiffness of left shoulder, not elsewhere classified: Secondary | ICD-10-CM | POA: Diagnosis not present

## 2017-02-22 DIAGNOSIS — M6281 Muscle weakness (generalized): Secondary | ICD-10-CM | POA: Diagnosis not present

## 2017-02-22 DIAGNOSIS — M25512 Pain in left shoulder: Principal | ICD-10-CM

## 2017-02-22 DIAGNOSIS — G8929 Other chronic pain: Secondary | ICD-10-CM

## 2017-02-22 NOTE — Therapy (Signed)
[facility] [address] [place], [place], [address] Phone: [phone]   Fax:  [phone]  Physical Therapy Evaluation  Patient Details  Name: [name] MRN: [id number] Date of Birth: 06/12/1946 Referring Provider: Francena HanlyKevin Supple MD  Encounter Date: 02/22/2017    Past Medical History:  Diagnosis Date  . CAD in native artery    a. remote stenting to the RCA. b. inferior MI 2009 secondary to stent thrombosis s/p angioplasty to RCA. c. DES to PDA 05/2013.  Marland Kitchen. Chronic neck pain   . Diabetes mellitus (HCC)   . Hyperlipidemia   . Ischemic cardiomyopathy    a. EF 45% in 2009 at time of MI. b. Subsequent EFs normal.  . Myocardial infarct (HCC) 09-11-06, 2009  . Orthostatic hypotension     Past Surgical History:  Procedure Laterality Date  . CARDIAC CATHETERIZATION  10-04-08  . CERVICAL SPINE SURGERY    . CORONARY ANGIOPLASTY WITH STENT PLACEMENT  05/2013  . LEFT HEART CATHETERIZATION WITH CORONARY ANGIOGRAM N/A 05/11/2013   Procedure: LEFT HEART CATHETERIZATION WITH CORONARY ANGIOGRAM;  Surgeon: Micheline ChapmanMichael D Cooper, MD;  [facility];  Service: Cardiovascular;  Laterality: N/A;  . LUMBAR LAMINECTOMY/DECOMPRESSION MICRODISCECTOMY Left 04/11/2015   Procedure: LUMBAR LAMINECTOMY/DECOMPRESSION MICRODISCECTOMY;  Surgeon: Estill BambergMark Dumonski, MD;  Location: [facility] OR;  Service: Orthopedics;  Laterality: Left;  Left sided lumbar 4-5 microdisectomy    There were no vitals filed for this visit.       Subjective Assessment - 02/22/17 1110    Subjective Patient's CC is continued pain and disturbed pai as a result.   Patient Stated Goals Use my left arm without pain.   Pain Score 5    Pain Location Shoulder   Pain Orientation Right   Pain Descriptors / Indicators Aching   Pain Type Surgical pain   Pain Onset More than a month ago                Objective measurements completed on examination: See above findings.          [facility]  Adult PT Treatment/Exercise - 02/22/17 0001      Shoulder Exercises: Pulleys   Flexion Limitations 5 minutes.   Other Pulley Exercises UE Ranger  on wall x 5 minutes.     Modalities   Modalities Ultrasound     Electrical Stimulation   Electrical Stimulation Location --  Left shoulder.   Electrical Stimulation Action IFC   Electrical Stimulation Parameters 80-150 Hz at 100% scan x 15 minutes.   Electrical Stimulation Goals Pain     Ultrasound   Ultrasound Location --  Left shoulder.   Ultrasound Parameters Combo e'stim(low level)/US(1.50 W/CM2) x 7 minutes to left UT and middle deltoid region.   Ultrasound Goals Pain     Manual Therapy   Manual Therapy Soft tissue mobilization   Soft tissue mobilization STW/M x 7 minutes including ischemic release technique to left UT and levator scapulae.                  PT Short Term Goals - 02/15/17 1025      PT SHORT TERM GOAL #1   Title independent with an initial HEP.   Time 3   Period Weeks   Status On-going     PT SHORT TERM GOAL #2   Title Restore full left shoulder PROM.   Time 3   Period Weeks   Status On-going  PT Long Term Goals - 02/15/17 1025      PT LONG TERM GOAL #1   Title Independent with an advanced HEP.   Time 8   Period Weeks   Status On-going     PT LONG TERM GOAL #2   Title Active left shoulder flexion to 145 degrees so the patient can easily reach overhead   Time 8   Period Weeks   Status On-going     PT LONG TERM GOAL #3   Title Increase ROM so patient is able to reach behind back to L3 with left hand.   Time 8   Period Weeks   Status On-going     PT LONG TERM GOAL #4   Title Increase left shoulder strength to a solid 4+/5 to increase stability for performance of functional activities   Time 8   Period Weeks   Status On-going     PT LONG TERM GOAL #5   Title Perform ADL's with pain not > 2/10.   Time 8   Period Weeks   Status On-going                 Plan - 02/22/17 1140    Clinical Impression Statement Patient responded very well to STW/M today over left UT region and reporte dless pain after treatment.      Patient will benefit from skilled therapeutic intervention in order to improve the following deficits and impairments:  Decreased activity tolerance, Decreased range of motion, Pain  Visit Diagnosis: Chronic left shoulder pain  Stiffness of left shoulder, not elsewhere classified  Muscle weakness (generalized)     Problem List Patient Active Problem List   Diagnosis Date Noted  . Hyperglycemia 06/12/2016  . Cardiomyopathy, ischemic 06/12/2016  . DOE (dyspnea on exertion) 05/23/2015  . DJD (degenerative joint disease), lumbosacral 05/23/2015  . Angina, class III (HCC) 05/12/2013  . Dyslipidemia 04/29/2009  . CAD S/P percutaneous coronary angioplasty 04/29/2009    Reginaldo Hazard, [place] MPT 02/22/2017, 11:46 AM  [facility] [address] [place], [place], [address] Phone: [phone]   Fax:  [phone]  Name: [name] MRN: [id number] Date of Birth: Aug 17, 1946

## 2017-02-25 ENCOUNTER — Ambulatory Visit: Payer: Medicare PPO | Admitting: Physical Therapy

## 2017-02-25 ENCOUNTER — Encounter: Payer: Self-pay | Admitting: Physical Therapy

## 2017-02-25 DIAGNOSIS — M25512 Pain in left shoulder: Principal | ICD-10-CM

## 2017-02-25 DIAGNOSIS — G8929 Other chronic pain: Secondary | ICD-10-CM | POA: Diagnosis not present

## 2017-02-25 DIAGNOSIS — M25612 Stiffness of left shoulder, not elsewhere classified: Secondary | ICD-10-CM | POA: Diagnosis not present

## 2017-02-25 DIAGNOSIS — M6281 Muscle weakness (generalized): Secondary | ICD-10-CM

## 2017-02-25 NOTE — Therapy (Signed)
[facility] [address] [place], [place], [address] Phone: [phone]   Fax:  [phone]  Physical Therapy Treatment  Patient Details  Name: [name] MRN: [id number] Date of Birth: 05/06/1946 Referring Provider: Francena HanlyKevin Supple MD  Encounter Date: 02/25/2017      PT End of Session - 02/25/17 1018    Visit Number 5   Number of Visits 12   Date for PT Re-Evaluation 03/10/17   PT Start Time 0944   PT Stop Time 1031   PT Time Calculation (min) 47 min   Activity Tolerance Patient tolerated treatment well   Behavior During Therapy [facility] for tasks assessed/performed      Past Medical History:  Diagnosis Date  . CAD in native artery    a. remote stenting to the RCA. b. inferior MI 2009 secondary to stent thrombosis s/p angioplasty to RCA. c. DES to PDA 05/2013.  Marland Kitchen. Chronic neck pain   . Diabetes mellitus (HCC)   . Hyperlipidemia   . Ischemic cardiomyopathy    a. EF 45% in 2009 at time of MI. b. Subsequent EFs normal.  . Myocardial infarct (HCC) 09-11-06, 2009  . Orthostatic hypotension     Past Surgical History:  Procedure Laterality Date  . CARDIAC CATHETERIZATION  10-04-08  . CERVICAL SPINE SURGERY    . CORONARY ANGIOPLASTY WITH STENT PLACEMENT  05/2013  . LEFT HEART CATHETERIZATION WITH CORONARY ANGIOGRAM N/A 05/11/2013   Procedure: LEFT HEART CATHETERIZATION WITH CORONARY ANGIOGRAM;  Surgeon: Micheline ChapmanMichael D Cooper, MD;  [facility];  Service: Cardiovascular;  Laterality: N/A;  . LUMBAR LAMINECTOMY/DECOMPRESSION MICRODISCECTOMY Left 04/11/2015   Procedure: LUMBAR LAMINECTOMY/DECOMPRESSION MICRODISCECTOMY;  Surgeon: Estill BambergMark Dumonski, MD;  Location: [facility] OR;  Service: Orthopedics;  Laterality: Left;  Left sided lumbar 4-5 microdisectomy    There were no vitals filed for this visit.      Subjective Assessment - 02/25/17 0946    Subjective Patient reported ongoing "ache" in shoulder   Pertinent History Severe left shoulder injury  and surgery as a youyh.   Patient Stated Goals Use my left arm without pain.   Currently in Pain? Yes   Pain Score 3    Pain Location Shoulder   Pain Orientation Right   Pain Descriptors / Indicators Aching   Pain Type Surgical pain   Pain Onset More than a month ago   Pain Frequency Constant   Aggravating Factors  any movement and at night   Pain Relieving Factors at rest            [facility] Assessment - 02/25/17 0001      ROM / Strength   AROM / PROM / Strength AROM;PROM     AROM   AROM Assessment Site Shoulder   Right/Left Shoulder Left   Left Shoulder Flexion 130 Degrees   Left Shoulder External Rotation 60 Degrees     PROM   PROM Assessment Site Shoulder   Right/Left Shoulder Left   Left Shoulder Flexion 137 Degrees   Left Shoulder External Rotation 70 Degrees                     [facility] Adult PT Treatment/Exercise - 02/25/17 0001      Shoulder Exercises: Pulleys   Flexion Limitations 5 minutes.   Other Pulley Exercises UE Ranger  on wall for elevation and circles     Electrical Stimulation   Electrical Stimulation Location Left shoulder.  IFC 80-150hz  x 15 mins  Electrical Stimulation Goals Pain     Vasopneumatic   Number Minutes Vasopneumatic  15 minutes   Vasopnuematic Location  Shoulder   Vasopneumatic Pressure Medium     Manual Therapy   Manual Therapy Passive ROM;Soft tissue mobilization   Soft tissue mobilization manual STW to left shoulder inferior posterior region area of pain   Passive ROM gentle PROM for left shoulder flexion/ER with gentle holds                  PT Short Term Goals - 02/15/17 1025      PT SHORT TERM GOAL #1   Title independent with an initial HEP.   Time 3   Period Weeks   Status On-going     PT SHORT TERM GOAL #2   Title Restore full left shoulder PROM.   Time 3   Period Weeks   Status On-going           PT Long Term Goals - 02/15/17 1025      PT LONG TERM GOAL #1   Title  Independent with an advanced HEP.   Time 8   Period Weeks   Status On-going     PT LONG TERM GOAL #2   Title Active left shoulder flexion to 145 degrees so the patient can easily reach overhead   Time 8   Period Weeks   Status On-going     PT LONG TERM GOAL #3   Title Increase ROM so patient is able to reach behind back to L3 with left hand.   Time 8   Period Weeks   Status On-going     PT LONG TERM GOAL #4   Title Increase left shoulder strength to a solid 4+/5 to increase stability for performance of functional activities   Time 8   Period Weeks   Status On-going     PT LONG TERM GOAL #5   Title Perform ADL's with pain not > 2/10.   Time 8   Period Weeks   Status On-going               Plan - 02/25/17 1019    Clinical Impression Statement Patient tolerated treatment well overall today. Patient has ongoing ache in left shoulder. Patient has improved with ROM for left shoulder flexion and ER today. Patient has reported using his arm for ADL's and lifting at times, such as a case of water bottles. Patient was educated on healing and protocol for healing. Patient current goals ongoing.    Rehab Potential Good   Clinical Impairments Affecting Rehab Potential surgery 12/25/16 current 9 weeks 02/26/17   PT Frequency 3x / week   PT Duration 4 weeks   PT Treatment/Interventions ADLs/Self Care Home Management;Cryotherapy;Electrical Stimulation;Moist Heat;Ultrasound;Therapeutic activities;Therapeutic exercise;Neuromuscular re-education;Patient/family education;Passive range of motion;Vasopneumatic Device   PT Next Visit Plan Please refer to standard RTC protocol...[facility].  Can begin pulleys and UE Ranger; PROM to improve left shoulder flexion and ER.  Modalites PRN for pain control. Supple 03/02/17   Consulted and Agree with Plan of Care Patient      Patient will benefit from skilled therapeutic intervention in order to improve the following deficits and impairments:   Decreased activity tolerance, Decreased range of motion, Pain  Visit Diagnosis: Chronic left shoulder pain  Stiffness of left shoulder, not elsewhere classified  Muscle weakness (generalized)     Problem List Patient Active Problem List   Diagnosis Date Noted  . Hyperglycemia 06/12/2016  . Cardiomyopathy, ischemic  06/12/2016  . DOE (dyspnea on exertion) 05/23/2015  . DJD (degenerative joint disease), lumbosacral 05/23/2015  . Angina, class III (HCC) 05/12/2013  . Dyslipidemia 04/29/2009  . CAD S/P percutaneous coronary angioplasty 04/29/2009    Alvarez, Jack, PTA 02/25/2017, 12:51 PM [name] MPT [facility] [address] [place], [place], [address] Phone: [phone]   Fax:  [phone]  Name: [name] MRN: [id number] Date of Birth: 1945-10-28

## 2017-03-01 ENCOUNTER — Encounter: Payer: Self-pay | Admitting: Physical Therapy

## 2017-03-02 ENCOUNTER — Encounter: Payer: Self-pay | Admitting: Physical Therapy

## 2017-03-02 ENCOUNTER — Ambulatory Visit: Payer: Medicare PPO | Attending: Orthopedic Surgery | Admitting: Physical Therapy

## 2017-03-02 DIAGNOSIS — M25612 Stiffness of left shoulder, not elsewhere classified: Secondary | ICD-10-CM | POA: Diagnosis not present

## 2017-03-02 DIAGNOSIS — M25512 Pain in left shoulder: Secondary | ICD-10-CM | POA: Insufficient documentation

## 2017-03-02 DIAGNOSIS — G8929 Other chronic pain: Secondary | ICD-10-CM | POA: Diagnosis not present

## 2017-03-02 DIAGNOSIS — M6281 Muscle weakness (generalized): Secondary | ICD-10-CM | POA: Diagnosis not present

## 2017-03-02 NOTE — Therapy (Signed)
[facility] [address] [place], [place], [address] Phone: [phone]   Fax:  [phone]  Physical Therapy Treatment  Patient Details  Name: [name] MRN: [id number] Date of Birth: 1946-03-30 Referring Provider: Francena Hanly MD  Encounter Date: 03/02/2017      PT End of Session - 03/02/17 0948    Visit Number 6   Number of Visits 24  per NO signed by MD on 03/01/2017 for 2-3x/week for 4 weeks   Date for PT Re-Evaluation 03/29/17   PT Start Time 0946   PT Stop Time 1029   PT Time Calculation (min) 43 min   Activity Tolerance Patient tolerated treatment well   Behavior During Therapy [facility] for tasks assessed/performed      Past Medical History:  Diagnosis Date  . CAD in native artery    a. remote stenting to the RCA. b. inferior MI 2009 secondary to stent thrombosis s/p angioplasty to RCA. c. DES to PDA 05/2013.  Marland Kitchen Chronic neck pain   . Diabetes mellitus (HCC)   . Hyperlipidemia   . Ischemic cardiomyopathy    a. EF 45% in 2009 at time of MI. b. Subsequent EFs normal.  . Myocardial infarct (HCC) 09-11-06, 2009  . Orthostatic hypotension     Past Surgical History:  Procedure Laterality Date  . CARDIAC CATHETERIZATION  10-04-08  . CERVICAL SPINE SURGERY    . CORONARY ANGIOPLASTY WITH STENT PLACEMENT  05/2013  . LEFT HEART CATHETERIZATION WITH CORONARY ANGIOGRAM N/A 05/11/2013   Procedure: LEFT HEART CATHETERIZATION WITH CORONARY ANGIOGRAM;  Surgeon: Micheline Chapman, MD;  Location: [facility] CATH LAB;  Service: Cardiovascular;  Laterality: N/A;  . LUMBAR LAMINECTOMY/DECOMPRESSION MICRODISCECTOMY Left 04/11/2015   Procedure: LUMBAR LAMINECTOMY/DECOMPRESSION MICRODISCECTOMY;  Surgeon: Estill Bamberg, MD;  Location: [facility] OR;  Service: Orthopedics;  Laterality: Left;  Left sided lumbar 4-5 microdisectomy    There were no vitals filed for this visit.      Subjective Assessment - 03/02/17 0945    Subjective Reports that the MD has prescribed  him Naproxen as they think the pain is from inflammation. Report difficulty with behind the back activities.   Pertinent History Severe left shoulder injury and surgery as a youyh.   Patient Stated Goals Use my left arm without pain.   Currently in Pain? Yes   Pain Score 2    Pain Location Shoulder   Pain Orientation Left   Pain Descriptors / Indicators Discomfort   Pain Type Surgical pain   Pain Onset More than a month ago            [facility] PT Assessment - 03/02/17 0001      Assessment   Medical Diagnosis Left shoulder arthroscopic surgery; RTC repair.   Onset Date/Surgical Date 12/25/16   Next MD Visit 04/07/2017     Restrictions   Weight Bearing Restrictions No                     [facility] Adult PT Treatment/Exercise - 03/02/17 0001      Shoulder Exercises: Supine   Protraction AROM;Left;Other (comment)  x30 reps   Flexion AROM;Both;20 reps   Other Supine Exercises AROM L shoulder circles x20 reps   Other Supine Exercises AROM L shoulder ABCs x1 rep     Shoulder Exercises: Sidelying   External Rotation AROM;Left;20 reps     Shoulder Exercises: Standing   Other Standing Exercises Wall slides x20 reps     Shoulder Exercises: Pulleys  Flexion 3 minutes   Other Pulley Exercises UE Ranger  on wall for elevation and circles x4 min   Other Pulley Exercises Wall ladder x5 min     Modalities   Modalities Electrical Stimulation;Vasopneumatic     Electrical Stimulation   Electrical Stimulation Location L shoulder   Electrical Stimulation Action IFC   Electrical Stimulation Parameters 1-10 hz x15 min   Electrical Stimulation Goals Pain     Vasopneumatic   Number Minutes Vasopneumatic  15 minutes   Vasopnuematic Location  Shoulder   Vasopneumatic Pressure Low   Vasopneumatic Temperature  50                  PT Short Term Goals - 02/15/17 1025      PT SHORT TERM GOAL #1   Title independent with an initial HEP.   Time 3   Period Weeks    Status On-going     PT SHORT TERM GOAL #2   Title Restore full left shoulder PROM.   Time 3   Period Weeks   Status On-going           PT Long Term Goals - 02/15/17 1025      PT LONG TERM GOAL #1   Title Independent with an advanced HEP.   Time 8   Period Weeks   Status On-going     PT LONG TERM GOAL #2   Title Active left shoulder flexion to 145 degrees so the patient can easily reach overhead   Time 8   Period Weeks   Status On-going     PT LONG TERM GOAL #3   Title Increase ROM so patient is able to reach behind back to L3 with left hand.   Time 8   Period Weeks   Status On-going     PT LONG TERM GOAL #4   Title Increase left shoulder strength to a solid 4+/5 to increase stability for performance of functional activities   Time 8   Period Weeks   Status On-going     PT LONG TERM GOAL #5   Title Perform ADL's with pain not > 2/10.   Time 8   Period Weeks   Status On-going               Plan - 03/02/17 1017    Clinical Impression Statement Patient tolerated today's treatment well with low level L shoulder discomfort upon arrival. Patient did experience intermittant popping during treatment which he commented were "unnerving." Patient did not report any other occurances of pain or discomfort with any AROM exercises completed today. Normal modalities response noted following removal of the modalities.   Rehab Potential Good   Clinical Impairments Affecting Rehab Potential surgery 12/25/16 current 9 weeks 02/26/17   PT Frequency 3x / week   PT Duration 4 weeks   PT Treatment/Interventions ADLs/Self Care Home Management;Cryotherapy;Electrical Stimulation;Moist Heat;Ultrasound;Therapeutic activities;Therapeutic exercise;Neuromuscular re-education;Patient/family education;Passive range of motion;Vasopneumatic Device   PT Next Visit Plan Continue per [facility] RTC protocol with modalities as needed for pain per MPT POC.   Consulted and Agree with Plan of  Care Patient      Patient will benefit from skilled therapeutic intervention in order to improve the following deficits and impairments:  Decreased activity tolerance, Decreased range of motion, Pain  Visit Diagnosis: Chronic left shoulder pain  Stiffness of left shoulder, not elsewhere classified  Muscle weakness (generalized)     Problem List Patient Active Problem List   Diagnosis Date  Noted  . Hyperglycemia 06/12/2016  . Cardiomyopathy, ischemic 06/12/2016  . DOE (dyspnea on exertion) 05/23/2015  . DJD (degenerative joint disease), lumbosacral 05/23/2015  . Angina, class III (HCC) 05/12/2013  . Dyslipidemia 04/29/2009  . CAD S/P percutaneous coronary angioplasty 04/29/2009    Evelene CroonKelsey M Parsons, PTA 03/02/2017, 10:32 AM  [facility] [address] [place], [place], [address] Phone: [phone]   Fax:  [phone]  Name: Woodroe ModeJohn R Marsicano MRN: [id number] Date of Birth: 12/16/1945

## 2017-03-04 ENCOUNTER — Ambulatory Visit: Payer: Medicare PPO | Admitting: Physical Therapy

## 2017-03-04 ENCOUNTER — Encounter: Payer: Self-pay | Admitting: Physical Therapy

## 2017-03-04 DIAGNOSIS — M25612 Stiffness of left shoulder, not elsewhere classified: Secondary | ICD-10-CM | POA: Diagnosis not present

## 2017-03-04 DIAGNOSIS — M25512 Pain in left shoulder: Secondary | ICD-10-CM | POA: Diagnosis not present

## 2017-03-04 DIAGNOSIS — G8929 Other chronic pain: Secondary | ICD-10-CM | POA: Diagnosis not present

## 2017-03-04 DIAGNOSIS — M6281 Muscle weakness (generalized): Secondary | ICD-10-CM | POA: Diagnosis not present

## 2017-03-04 NOTE — Therapy (Signed)
[facility] [address] [place], [place], [address] Phone: [phone]   Fax:  [phone]  Physical Therapy Treatment  Patient Details  Name: [name] MRN: [id number] Date of Birth: 17-Aug-1946 Referring Provider: Francena Hanly MD  Encounter Date: 03/04/2017      PT End of Session - 03/04/17 1119    Visit Number 7   Number of Visits 24   Date for PT Re-Evaluation 03/29/17   PT Start Time 1117   PT Stop Time 1202   PT Time Calculation (min) 45 min   Activity Tolerance Patient tolerated treatment well   Behavior During Therapy [facility] for tasks assessed/performed      Past Medical History:  Diagnosis Date  . CAD in native artery    a. remote stenting to the RCA. b. inferior MI 2009 secondary to stent thrombosis s/p angioplasty to RCA. c. DES to PDA 05/2013.  Marland Kitchen Chronic neck pain   . Diabetes mellitus (HCC)   . Hyperlipidemia   . Ischemic cardiomyopathy    a. EF 45% in 2009 at time of MI. b. Subsequent EFs normal.  . Myocardial infarct (HCC) 09-11-06, 2009  . Orthostatic hypotension     Past Surgical History:  Procedure Laterality Date  . CARDIAC CATHETERIZATION  10-04-08  . CERVICAL SPINE SURGERY    . CORONARY ANGIOPLASTY WITH STENT PLACEMENT  05/2013  . LEFT HEART CATHETERIZATION WITH CORONARY ANGIOGRAM N/A 05/11/2013   Procedure: LEFT HEART CATHETERIZATION WITH CORONARY ANGIOGRAM;  Surgeon: Micheline Chapman, MD;  Location: [facility] CATH LAB;  Service: Cardiovascular;  Laterality: N/A;  . LUMBAR LAMINECTOMY/DECOMPRESSION MICRODISCECTOMY Left 04/11/2015   Procedure: LUMBAR LAMINECTOMY/DECOMPRESSION MICRODISCECTOMY;  Surgeon: Estill Bamberg, MD;  Location: [facility] OR;  Service: Orthopedics;  Laterality: Left;  Left sided lumbar 4-5 microdisectomy    There were no vitals filed for this visit.      Subjective Assessment - 03/04/17 1118    Subjective Reports that pain in L shoulder still present and still enough to aggravate. Pain is worse with  sleeping per patient report and less noticable with activity.   Pertinent History Severe left shoulder injury and surgery as a youyh.   Patient Stated Goals Use my left arm without pain.   Currently in Pain? Yes   Pain Score 2    Pain Location Shoulder   Pain Orientation Left   Pain Descriptors / Indicators Aching   Pain Type Surgical pain   Pain Onset More than a month ago   Pain Frequency Constant            [facility] PT Assessment - 03/04/17 0001      Assessment   Medical Diagnosis Left shoulder arthroscopic surgery; RTC repair.   Onset Date/Surgical Date 12/25/16   Next MD Visit 04/07/2017     Restrictions   Weight Bearing Restrictions No                     [facility] Adult PT Treatment/Exercise - 03/04/17 0001      Shoulder Exercises: Supine   Protraction AROM;Left;20 reps   Flexion AROM;Both;20 reps   Flexion Limitations reclined for lawnchair progression   Other Supine Exercises AROM L shoulder circles, triangles x20 reps   Other Supine Exercises AROM L shoulder ABCs x1 rep     Shoulder Exercises: Sidelying   External Rotation AROM;Left;20 reps   Flexion AROM;Left;20 reps     Shoulder Exercises: Pulleys   Flexion Other (comment)  x5 min   Other  Pulley Exercises UE Ranger  on wall for elevation and circles x4 min   Other Pulley Exercises LUE wall ladder x15 reps (Level 30)     Modalities   Modalities Electrical Stimulation;Vasopneumatic     Electrical Stimulation   Electrical Stimulation Location L shoulder   Electrical Stimulation Action IFC   Electrical Stimulation Parameters 1-10 hz x15 min   Electrical Stimulation Goals Pain     Vasopneumatic   Number Minutes Vasopneumatic  15 minutes   Vasopnuematic Location  Shoulder   Vasopneumatic Pressure Low   Vasopneumatic Temperature  34                  PT Short Term Goals - 02/15/17 1025      PT SHORT TERM GOAL #1   Title independent with an initial HEP.   Time 3   Period Weeks    Status On-going     PT SHORT TERM GOAL #2   Title Restore full left shoulder PROM.   Time 3   Period Weeks   Status On-going           PT Long Term Goals - 02/15/17 1025      PT LONG TERM GOAL #1   Title Independent with an advanced HEP.   Time 8   Period Weeks   Status On-going     PT LONG TERM GOAL #2   Title Active left shoulder flexion to 145 degrees so the patient can easily reach overhead   Time 8   Period Weeks   Status On-going     PT LONG TERM GOAL #3   Title Increase ROM so patient is able to reach behind back to L3 with left hand.   Time 8   Period Weeks   Status On-going     PT LONG TERM GOAL #4   Title Increase left shoulder strength to a solid 4+/5 to increase stability for performance of functional activities   Time 8   Period Weeks   Status On-going     PT LONG TERM GOAL #5   Title Perform ADL's with pain not > 2/10.   Time 8   Period Weeks   Status On-going               Plan - 03/04/17 1147    Clinical Impression Statement Patient tolerated today's treatment well with only low level L shoulder ache upon arrival. Patient guided through various L shoulder AROM and strengthening exercises in standing as well as sidelying and reclined. Patient denied any L shoulder pain with any of the exercises today. L shoulder control exercises completed in supine as well today with no complaint from patient. Normal modalities response noted following removal of the modalities.    Rehab Potential Good   Clinical Impairments Affecting Rehab Potential surgery 12/25/16 current 9 weeks 02/26/17   PT Frequency 3x / week   PT Duration 4 weeks   PT Treatment/Interventions ADLs/Self Care Home Management;Cryotherapy;Electrical Stimulation;Moist Heat;Ultrasound;Therapeutic activities;Therapeutic exercise;Neuromuscular re-education;Patient/family education;Passive range of motion;Vasopneumatic Device   PT Next Visit Plan Progress to L RTC and shoulder girdle  strengthening per [facility] protocol with modalities PRN for pain relief per MPT POC.   Consulted and Agree with Plan of Care Patient      Patient will benefit from skilled therapeutic intervention in order to improve the following deficits and impairments:  Decreased activity tolerance, Decreased range of motion, Pain  Visit Diagnosis: Chronic left shoulder pain  Stiffness of left shoulder,  not elsewhere classified  Muscle weakness (generalized)     Problem List Patient Active Problem List   Diagnosis Date Noted  . Hyperglycemia 06/12/2016  . Cardiomyopathy, ischemic 06/12/2016  . DOE (dyspnea on exertion) 05/23/2015  . DJD (degenerative joint disease), lumbosacral 05/23/2015  . Angina, class III (HCC) 05/12/2013  . Dyslipidemia 04/29/2009  . CAD S/P percutaneous coronary angioplasty 04/29/2009    Evelene Croon, PTA 03/04/2017, 12:04 PM  [facility] [address] [place], [place], [address] Phone: [phone]   Fax:  [phone]  Name: AMILLION MACCHIA MRN: [id number] Date of Birth: 18-Jul-1946

## 2017-03-09 ENCOUNTER — Encounter: Payer: Self-pay | Admitting: Physical Therapy

## 2017-03-09 ENCOUNTER — Ambulatory Visit: Payer: Medicare PPO | Admitting: Physical Therapy

## 2017-03-09 DIAGNOSIS — G8929 Other chronic pain: Secondary | ICD-10-CM | POA: Diagnosis not present

## 2017-03-09 DIAGNOSIS — M25512 Pain in left shoulder: Secondary | ICD-10-CM | POA: Diagnosis not present

## 2017-03-09 DIAGNOSIS — M6281 Muscle weakness (generalized): Secondary | ICD-10-CM

## 2017-03-09 DIAGNOSIS — M25612 Stiffness of left shoulder, not elsewhere classified: Secondary | ICD-10-CM

## 2017-03-09 NOTE — Therapy (Signed)
[facility] [address] [place], [place], [address] Phone: [phone]   Fax:  [phone]  Physical Therapy Treatment  Patient Details  Name: [name] MRN: [id number] Date of Birth: Dec 26, 1945 Referring Provider: Francena Hanly MD  Encounter Date: 03/09/2017      PT End of Session - 03/09/17 1713    Visit Number 8   Number of Visits 24   Date for PT Re-Evaluation 03/29/17   PT Start Time 1609   PT Stop Time 1653   PT Time Calculation (min) 44 min   Activity Tolerance Patient tolerated treatment well   Behavior During Therapy [facility] for tasks assessed/performed      Past Medical History:  Diagnosis Date  . CAD in native artery    a. remote stenting to the RCA. b. inferior MI 2009 secondary to stent thrombosis s/p angioplasty to RCA. c. DES to PDA 05/2013.  Marland Kitchen Chronic neck pain   . Diabetes mellitus (HCC)   . Hyperlipidemia   . Ischemic cardiomyopathy    a. EF 45% in 2009 at time of MI. b. Subsequent EFs normal.  . Myocardial infarct (HCC) 09-11-06, 2009  . Orthostatic hypotension     Past Surgical History:  Procedure Laterality Date  . CARDIAC CATHETERIZATION  10-04-08  . CERVICAL SPINE SURGERY    . CORONARY ANGIOPLASTY WITH STENT PLACEMENT  05/2013  . LEFT HEART CATHETERIZATION WITH CORONARY ANGIOGRAM N/A 05/11/2013   Procedure: LEFT HEART CATHETERIZATION WITH CORONARY ANGIOGRAM;  Surgeon: Micheline Chapman, MD;  Location: [facility] CATH LAB;  Service: Cardiovascular;  Laterality: N/A;  . LUMBAR LAMINECTOMY/DECOMPRESSION MICRODISCECTOMY Left 04/11/2015   Procedure: LUMBAR LAMINECTOMY/DECOMPRESSION MICRODISCECTOMY;  Surgeon: Estill Bamberg, MD;  Location: [facility] OR;  Service: Orthopedics;  Laterality: Left;  Left sided lumbar 4-5 microdisectomy    There were no vitals filed for this visit.      Subjective Assessment - 03/09/17 1710    Subjective Reports that his shoulder feels "alright." Denies any shoulder popping.   Pertinent History  Severe left shoulder injury and surgery as a youyh.   Patient Stated Goals Use my left arm without pain.   Currently in Pain? No/denies            [facility] PT Assessment - 03/09/17 0001      Assessment   Medical Diagnosis Left shoulder arthroscopic surgery; RTC repair.   Onset Date/Surgical Date 12/25/16   Next MD Visit 04/07/2017     Restrictions   Weight Bearing Restrictions No                     [facility] Adult PT Treatment/Exercise - 03/09/17 0001      Shoulder Exercises: Supine   Protraction Strengthening;Left;20 reps;Weights   Protraction Weight (lbs) 1   Flexion Strengthening;Both;20 reps;Weights   Shoulder Flexion Weight (lbs) 1   Flexion Limitations reclined for lawnchair progression     Shoulder Exercises: Prone   Retraction Strengthening;Left;20 reps;Weights   Retraction Weight (lbs) 1   Extension Strengthening;Left;20 reps;Weights   Extension Weight (lbs) 1     Shoulder Exercises: Sidelying   External Rotation Strengthening;Left;20 reps;Weights   External Rotation Weight (lbs) 1     Shoulder Exercises: Standing   Protraction Strengthening;Left;20 reps;Theraband   Theraband Level (Shoulder Protraction) Level 1 (Yellow)   External Rotation Strengthening;Left;20 reps;Theraband   Theraband Level (Shoulder External Rotation) Level 1 (Yellow)   Internal Rotation Strengthening;Left;20 reps;Theraband   Theraband Level (Shoulder Internal Rotation) Level 1 (Yellow)   Extension  Strengthening;Left;20 reps;Theraband   Theraband Level (Shoulder Extension) Level 1 (Yellow)   Row Strengthening;Left;20 reps;Theraband   Theraband Level (Shoulder Row) Level 1 (Yellow)     Shoulder Exercises: Pulleys   Flexion Other (comment)  x5 min   Other Pulley Exercises UE Ranger  on wall for elevation  x20   Other Pulley Exercises Wall slides x30 reps     Modalities   Modalities Electrical Stimulation;Vasopneumatic     Electrical Stimulation   Electrical Stimulation  Location L shoulder   Electrical Stimulation Action IFC   Electrical Stimulation Parameters 1-10 hz x15 min   Electrical Stimulation Goals Pain     Vasopneumatic   Number Minutes Vasopneumatic  15 minutes   Vasopnuematic Location  Shoulder   Vasopneumatic Pressure Low   Vasopneumatic Temperature  34                  PT Short Term Goals - 02/15/17 1025      PT SHORT TERM GOAL #1   Title independent with an initial HEP.   Time 3   Period Weeks   Status On-going     PT SHORT TERM GOAL #2   Title Restore full left shoulder PROM.   Time 3   Period Weeks   Status On-going           PT Long Term Goals - 02/15/17 1025      PT LONG TERM GOAL #1   Title Independent with an advanced HEP.   Time 8   Period Weeks   Status On-going     PT LONG TERM GOAL #2   Title Active left shoulder flexion to 145 degrees so the patient can easily reach overhead   Time 8   Period Weeks   Status On-going     PT LONG TERM GOAL #3   Title Increase ROM so patient is able to reach behind back to L3 with left hand.   Time 8   Period Weeks   Status On-going     PT LONG TERM GOAL #4   Title Increase left shoulder strength to a solid 4+/5 to increase stability for performance of functional activities   Time 8   Period Weeks   Status On-going     PT LONG TERM GOAL #5   Title Perform ADL's with pain not > 2/10.   Time 8   Period Weeks   Status On-going               Plan - 03/09/17 1748    Clinical Impression Statement Patient tolerated today's treatment well with no pain upon arrival or during treatment. No L shoulder popping experienced per patient report as well. Patient guided through initital L shoulder strengthening with minimal multimodal cueing for proper technique. No abnormal compensatory strategies noted today as limited antigravity exercises completed. Normal modalities response noted following removal of the modalites.   Rehab Potential Good   Clinical  Impairments Affecting Rehab Potential surgery 12/25/16 current 9 weeks 02/26/17   PT Frequency 3x / week   PT Duration 4 weeks   PT Treatment/Interventions ADLs/Self Care Home Management;Cryotherapy;Electrical Stimulation;Moist Heat;Ultrasound;Therapeutic activities;Therapeutic exercise;Neuromuscular re-education;Patient/family education;Passive range of motion;Vasopneumatic Device   PT Next Visit Plan Progress L RTC and shoulder girdle strengthening per [facility] protocol with modalities PRN for pain relief per MPT POC.   Consulted and Agree with Plan of Care Patient      Patient will benefit from skilled therapeutic intervention in order to improve the  following deficits and impairments:  Decreased activity tolerance, Decreased range of motion, Pain  Visit Diagnosis: Chronic left shoulder pain  Stiffness of left shoulder, not elsewhere classified  Muscle weakness (generalized)     Problem List Patient Active Problem List   Diagnosis Date Noted  . Hyperglycemia 06/12/2016  . Cardiomyopathy, ischemic 06/12/2016  . DOE (dyspnea on exertion) 05/23/2015  . DJD (degenerative joint disease), lumbosacral 05/23/2015  . Angina, class III (HCC) 05/12/2013  . Dyslipidemia 04/29/2009  . CAD S/P percutaneous coronary angioplasty 04/29/2009    Evelene CroonKelsey M Parsons, PTA 03/09/2017, 5:54 PM  [facility] [address] [place], [place], [address] Phone: [phone]   Fax:  [phone]  Name: Woodroe ModeJohn R Alvarez MRN: [id number] Date of Birth: 09/23/1945

## 2017-03-11 ENCOUNTER — Ambulatory Visit: Payer: Medicare PPO | Admitting: Physical Therapy

## 2017-03-11 DIAGNOSIS — M25512 Pain in left shoulder: Secondary | ICD-10-CM | POA: Diagnosis not present

## 2017-03-11 DIAGNOSIS — M25612 Stiffness of left shoulder, not elsewhere classified: Secondary | ICD-10-CM

## 2017-03-11 DIAGNOSIS — M6281 Muscle weakness (generalized): Secondary | ICD-10-CM | POA: Diagnosis not present

## 2017-03-11 DIAGNOSIS — G8929 Other chronic pain: Secondary | ICD-10-CM | POA: Diagnosis not present

## 2017-03-11 NOTE — Therapy (Signed)
[facility] [address] [place], [place], [address] Phone: [phone]   Fax:  [phone]  Physical Therapy Treatment  Patient Details  Name: Woodroe ModeJohn R Berthelot MRN: [id number] Date of Birth: 09/23/1945 Referring Provider: Francena HanlyKevin Supple MD  Encounter Date: 03/11/2017      PT End of Session - 03/11/17 1119    Visit Number 9   Number of Visits 24   Date for PT Re-Evaluation 03/29/17   PT Start Time 0945   PT Stop Time 1033   PT Time Calculation (min) 48 min   Activity Tolerance Patient tolerated treatment well   Behavior [facility] for tasks assessed/performed      Past Medical History:  Diagnosis Date  . CAD in native artery    a. remote stenting to the RCA. b. inferior MI 2009 secondary to stent thrombosis s/p angioplasty to RCA. c. DES to PDA 05/2013.  Marland Kitchen. Chronic neck pain   . Diabetes mellitus (HCC)   . Hyperlipidemia   . Ischemic cardiomyopathy    a. EF 45% in 2009 at time of MI. b. Subsequent EFs normal.  . Myocardial infarct (HCC) 09-11-06, 2009  . Orthostatic hypotension     Past Surgical History:  Procedure Laterality Date  . CARDIAC CATHETERIZATION  10-04-08  . CERVICAL SPINE SURGERY    . CORONARY ANGIOPLASTY WITH STENT PLACEMENT  05/2013  . LEFT HEART CATHETERIZATION WITH CORONARY ANGIOGRAM N/A 05/11/2013   Procedure: LEFT HEART CATHETERIZATION WITH CORONARY ANGIOGRAM;  Surgeon: Micheline ChapmanMichael D Cooper, MD;  [facility];  Service: Cardiovascular;  Laterality: N/A;  . LUMBAR LAMINECTOMY/DECOMPRESSION MICRODISCECTOMY Left 04/11/2015   Procedure: LUMBAR LAMINECTOMY/DECOMPRESSION MICRODISCECTOMY;  Surgeon: Estill BambergMark Dumonski, MD;  Location: [facility] OR;  Service: Orthopedics;  Laterality: Left;  Left sided lumbar 4-5 microdisectomy    There were no vitals filed for this visit.      Subjective Assessment - 03/11/17 1120    Subjective I'm much better.      Treatment:  UBE x 10 minutes.  Pulleys x 5 minutes.  UE ranger on  wall x 5 minutes.  SDLY manually resisted ER to fatigue.  IFC to left shoulder with HMP.  Flexion= 145 degrees and ER= 75 degrees.  Excellent job today.                               PT Short Term Goals - 02/15/17 1025      PT SHORT TERM GOAL #1   Title independent with an initial HEP.   Time 3   Period Weeks   Status On-going     PT SHORT TERM GOAL #2   Title Restore full left shoulder PROM.   Time 3   Period Weeks   Status On-going           PT Long Term Goals - 02/15/17 1025      PT LONG TERM GOAL #1   Title Independent with an advanced HEP.   Time 8   Period Weeks   Status On-going     PT LONG TERM GOAL #2   Title Active left shoulder flexion to 145 degrees so the patient can easily reach overhead   Time 8   Period Weeks   Status On-going     PT LONG TERM GOAL #3   Title Increase ROM so patient is able to reach behind back to L3 with left hand.   Time 8  Period Weeks   Status On-going     PT LONG TERM GOAL #4   Title Increase left shoulder strength to a solid 4+/5 to increase stability for performance of functional activities   Time 8   Period Weeks   Status On-going     PT LONG TERM GOAL #5   Title Perform ADL's with pain not > 2/10.   Time 8   Period Weeks   Status On-going             Patient will benefit from skilled therapeutic intervention in order to improve the following deficits and impairments:     Visit Diagnosis: Chronic left shoulder pain  Stiffness of left shoulder, not elsewhere classified  Muscle weakness (generalized)     Problem List Patient Active Problem List   Diagnosis Date Noted  . Hyperglycemia 06/12/2016  . Cardiomyopathy, ischemic 06/12/2016  . DOE (dyspnea on exertion) 05/23/2015  . DJD (degenerative joint disease), lumbosacral 05/23/2015  . Angina, class III (HCC) 05/12/2013  . Dyslipidemia 04/29/2009  . CAD S/P percutaneous coronary angioplasty 04/29/2009    Omeka Holben, [place]  MPT 03/11/2017, 11:27 AM  [facility] [address] [place], [place], [address] Phone: [phone]   Fax:  [phone]  Name: TEVIS DUNAVAN MRN: [id number] Date of Birth: [birth year]

## 2017-03-15 DIAGNOSIS — Z87891 Personal history of nicotine dependence: Secondary | ICD-10-CM | POA: Diagnosis not present

## 2017-03-15 DIAGNOSIS — Z0000 Encounter for general adult medical examination without abnormal findings: Secondary | ICD-10-CM | POA: Diagnosis not present

## 2017-03-15 DIAGNOSIS — Z7984 Long term (current) use of oral hypoglycemic drugs: Secondary | ICD-10-CM | POA: Diagnosis not present

## 2017-03-15 DIAGNOSIS — Z136 Encounter for screening for cardiovascular disorders: Secondary | ICD-10-CM | POA: Diagnosis not present

## 2017-03-15 DIAGNOSIS — E7800 Pure hypercholesterolemia, unspecified: Secondary | ICD-10-CM | POA: Diagnosis not present

## 2017-03-15 DIAGNOSIS — E119 Type 2 diabetes mellitus without complications: Secondary | ICD-10-CM | POA: Diagnosis not present

## 2017-03-16 ENCOUNTER — Ambulatory Visit: Payer: Medicare PPO | Admitting: Physical Therapy

## 2017-03-16 DIAGNOSIS — M6281 Muscle weakness (generalized): Secondary | ICD-10-CM | POA: Diagnosis not present

## 2017-03-16 DIAGNOSIS — G8929 Other chronic pain: Secondary | ICD-10-CM | POA: Diagnosis not present

## 2017-03-16 DIAGNOSIS — M25612 Stiffness of left shoulder, not elsewhere classified: Secondary | ICD-10-CM

## 2017-03-16 DIAGNOSIS — M25512 Pain in left shoulder: Secondary | ICD-10-CM | POA: Diagnosis not present

## 2017-03-16 NOTE — Therapy (Signed)
[facility] [address] [place], [place], [address] Phone: [phone]   Fax:  [phone]  Physical Therapy Treatment  Patient Details  Name: [name] MRN: [id number] Date of Birth: 03-21-1946 Referring Provider: Francena Hanly MD  Encounter Date: 03/16/2017      PT End of Session - 03/16/17 0846    Visit Number 10   Number of Visits 24   Date for PT Re-Evaluation 03/29/17   PT Start Time 0730   PT Stop Time 0824   PT Time Calculation (min) 54 min   Activity Tolerance Patient tolerated treatment well   Behavior During Therapy [facility] for tasks assessed/performed      Past Medical History:  Diagnosis Date  . CAD in native artery    a. remote stenting to the RCA. b. inferior MI 2009 secondary to stent thrombosis s/p angioplasty to RCA. c. DES to PDA 05/2013.  Marland Kitchen Chronic neck pain   . Diabetes mellitus (HCC)   . Hyperlipidemia   . Ischemic cardiomyopathy    a. EF 45% in 2009 at time of MI. b. Subsequent EFs normal.  . Myocardial infarct (HCC) 09-11-06, 2009  . Orthostatic hypotension     Past Surgical History:  Procedure Laterality Date  . CARDIAC CATHETERIZATION  10-04-08  . CERVICAL SPINE SURGERY    . CORONARY ANGIOPLASTY WITH STENT PLACEMENT  05/2013  . LEFT HEART CATHETERIZATION WITH CORONARY ANGIOGRAM N/A 05/11/2013   Procedure: LEFT HEART CATHETERIZATION WITH CORONARY ANGIOGRAM;  Surgeon: Micheline Chapman, MD;  [facility] CATH LAB;  Service: Cardiovascular;  Laterality: N/A;  . LUMBAR LAMINECTOMY/DECOMPRESSION MICRODISCECTOMY Left 04/11/2015   Procedure: LUMBAR LAMINECTOMY/DECOMPRESSION MICRODISCECTOMY;  Surgeon: Estill Bamberg, MD;  Location: [facility] OR;  Service: Orthopedics;  Laterality: Left;  Left sided lumbar 4-5 microdisectomy    There were no vitals filed for this visit.      Subjective Assessment - 03/16/17 0743    Subjective My shoulder has been bothering me since last Thursday.  It wakes me at night.  I have to lift  it with my right arm.  I lifted a 50# dog food sack over my shoulder.     Treatment:  UBE x 10 minutes f/b pulleys 7  Minutes f/b Combo e'stim/U/S at 1.50 W/CM2 x 8 minutes to left shoulder f/b constant Pre-mod e'stim x 20 minutes.                                PT Short Term Goals - 02/15/17 1025      PT SHORT TERM GOAL #1   Title independent with an initial HEP.   Time 3   Period Weeks   Status On-going     PT SHORT TERM GOAL #2   Title Restore full left shoulder PROM.   Time 3   Period Weeks   Status On-going           PT Long Term Goals - 02/15/17 1025      PT LONG TERM GOAL #1   Title Independent with an advanced HEP.   Time 8   Period Weeks   Status On-going     PT LONG TERM GOAL #2   Title Active left shoulder flexion to 145 degrees so the patient can easily reach overhead   Time 8   Period Weeks   Status On-going     PT LONG TERM GOAL #3   Title Increase ROM so  patient is able to reach behind back to L3 with left hand.   Time 8   Period Weeks   Status On-going     PT LONG TERM GOAL #4   Title Increase left shoulder strength to a solid 4+/5 to increase stability for performance of functional activities   Time 8   Period Weeks   Status On-going     PT LONG TERM GOAL #5   Title Perform ADL's with pain not > 2/10.   Time 8   Period Weeks   Status On-going             Patient will benefit from skilled therapeutic intervention in order to improve the following deficits and impairments:     Visit Diagnosis: Chronic left shoulder pain  Stiffness of left shoulder, not elsewhere classified  Muscle weakness (generalized)       G-Codes - 03/16/17 0738    Functional Assessment Tool Used (Outpatient Only) FOTO...31% limitation.   Functional Limitation Self care   Self Care Current Status [phone]([id number]) At least 20 percent but less than 40 percent impaired, limited or restricted   Self Care Goal Status ([id number](G8988) At least 1  percent but less than 20 percent impaired, limited or restricted      Problem List Patient Active Problem List   Diagnosis Date Noted  . Hyperglycemia 06/12/2016  . Cardiomyopathy, ischemic 06/12/2016  . DOE (dyspnea on exertion) 05/23/2015  . DJD (degenerative joint disease), lumbosacral 05/23/2015  . Angina, class III (HCC) 05/12/2013  . Dyslipidemia 04/29/2009  . CAD S/P percutaneous coronary angioplasty 04/29/2009    APPLE[place]ATE, [place] MPT 03/16/2017, 8:47 AM  [facility] [address] [place], [place], [address] Phone: [phone]   Fax:  [phone]  Name: [name] MRN: [id number] Date of Birth: 08/06/1946

## 2017-03-18 ENCOUNTER — Other Ambulatory Visit (HOSPITAL_BASED_OUTPATIENT_CLINIC_OR_DEPARTMENT_OTHER): Payer: Self-pay | Admitting: Family Medicine

## 2017-03-18 DIAGNOSIS — Z136 Encounter for screening for cardiovascular disorders: Secondary | ICD-10-CM

## 2017-03-19 ENCOUNTER — Ambulatory Visit: Payer: Medicare PPO | Admitting: Physical Therapy

## 2017-03-19 DIAGNOSIS — G8929 Other chronic pain: Secondary | ICD-10-CM

## 2017-03-19 DIAGNOSIS — M25612 Stiffness of left shoulder, not elsewhere classified: Secondary | ICD-10-CM | POA: Diagnosis not present

## 2017-03-19 DIAGNOSIS — M6281 Muscle weakness (generalized): Secondary | ICD-10-CM

## 2017-03-19 DIAGNOSIS — M25512 Pain in left shoulder: Secondary | ICD-10-CM | POA: Diagnosis not present

## 2017-03-19 NOTE — Patient Instructions (Signed)
Thoracic Self-Mobilization Stretch (Supine)   With small rolled towel at lower ribs level, gently lie back until stretch is felt. Hold _30-60___ seconds. Work up to 5 min. Relax. Repeat _2-3___ times per set. Do ____ sets per session. Do ___2_ sessions per day.  http://orth.exer.us/994   Copyright  VHI. All rights reserved.   Protraction: Push-Up (Wall)  Lean to wall, feet flat, ____ inches from wall, arms bent, trunk straight, hands directly in front of shoulders, thumbs facing each other. Push to straight arms. Repeat __10-30__ times per set. Do ____ sets per session. Do _3___ sessions per week.    Posture - Sitting   Sit upright, head facing forward. Try using a roll to support lower back. Keep shoulders relaxed, and avoid rounded back. Keep hips level with knees. Avoid crossing legs for long periods.   Flexibility: Corner Stretch   Standing in corner or a doorway with hands just above shoulder level.  Lean forward until a comfortable stretch is felt across chest. Hold __30__ seconds. Repeat __3__ times per set.  Do _2___ sessions per day.  http://orth.exer.us/342   Copyright  VHI. All rights reserved.   Solon PalmJulie Conan Mcmanaway, PT 03/19/17 10:25 AM [facility] [address] [place], [place], [address] Phone: [phone]   Fax:  [phone]

## 2017-03-19 NOTE — Therapy (Signed)
[name], [place], [address] Phone: [phone]   Fax:  [phone]  Physical Therapy Treatment  Patient Details  Name: [name] MRN: [id number] Date of Birth: 08/11/1946 Referring Provider: Justice Britain Alvarez  Encounter Date: 03/19/2017      PT End of Session - 03/19/17 0948    Visit Number 11   Number of Visits 24   Date for PT Re-Evaluation 03/29/17   PT Start Time 0945   PT Stop Time 1029   PT Time Calculation (min) 44 min   Activity Tolerance Patient tolerated treatment well   Behavior During Therapy [facility] for tasks assessed/performed      Past Medical History:  Diagnosis Date  . CAD in native artery    a. remote stenting to the RCA. b. inferior MI 2009 secondary to stent thrombosis s/p angioplasty to RCA. c. DES to PDA 05/2013.  Marland Kitchen Chronic neck pain   . Diabetes mellitus ([place])   . Hyperlipidemia   . Ischemic cardiomyopathy    a. EF 45% in 2009 at time of MI. b. Subsequent EFs normal.  . Myocardial infarct ([place]) 09-11-06, 2009  . Orthostatic hypotension     Past Surgical History:  Procedure Laterality Date  . CARDIAC CATHETERIZATION  10-04-08  . CERVICAL SPINE SURGERY    . CORONARY ANGIOPLASTY WITH STENT PLACEMENT  05/2013  . LEFT HEART CATHETERIZATION WITH CORONARY ANGIOGRAM N/A 05/11/2013   Procedure: LEFT HEART CATHETERIZATION WITH CORONARY ANGIOGRAM;  Surgeon: Jack Ohara, Alvarez;  Location[facility] CATH LAB;  Service: Cardiovascular;  Laterality: N/A;  . LUMBAR LAMINECTOMY/DECOMPRESSION MICRODISCECTOMY Left 04/11/2015   Procedure: LUMBAR LAMINECTOMY/DECOMPRESSION MICRODISCECTOMY;  Surgeon: Jack Bob, Alvarez;  Location: [place];  Service: Orthopedics;  Laterality: Left;  Left sided lumbar 4-5 microdisectomy    There were no vitals filed for this visit.      Subjective Assessment - 03/19/17 0949    Subjective Patient reports he doesn't feel that rehab is helping him at all anymore. He feels he has  good mobility and strength. Pain is still his main complaint and only at night. It woke him 3 times last night and is not improving.   Pertinent History Severe left shoulder injury and surgery as a youyh.   Patient Stated Goals Use my left arm without pain.   Currently in Pain? No/denies  4/10 pain at night            [facility] PT Assessment - 03/19/17 0001      Strength   Overall Strength Comments L shoulder flex, IR, ext 5/5; ABD 4+/5, ER 5-/5                     [facility] Adult PT Treatment/Exercise - 03/19/17 0001      Self-Care   Self-Care Other Self-Care Comments   Other Self-Care Comments  self MFR using ball to parascapular muscles; also using therabar     Shoulder Exercises: Supine   Other Supine Exercises thoracic extension over bolster  painful at certain levels     Shoulder Exercises: Prone   Horizontal ABduction 1 Strengthening;Left;20 reps;10 reps  palm down; 10 reps with horizontal ABD   Other Prone Exercises prone press up x 10     Shoulder Exercises: Pulleys   Flexion Other (comment)  x5 min     Shoulder Exercises: Stretch   Corner Stretch 1 rep;30 seconds   Corner Stretch Limitations done in Administrator - Flexion  1 rep;20 seconds     Manual Therapy   Manual Therapy Joint mobilization;Myofascial release                PT Education - 03/19/17 1222    Education provided Yes   Education Details HEP   Person(s) Educated Patient   Methods Explanation;Demonstration;Handout   Comprehension Verbalized understanding;Returned demonstration          PT Short Term Goals - 02/15/17 1025      PT SHORT TERM GOAL #1   Title independent with an initial HEP.   Time 3   Period Weeks   Status On-going     PT SHORT TERM GOAL #2   Title Restore full left shoulder PROM.   Time 3   Period Weeks   Status On-going           PT Long Term Goals - 03/19/17 0951      PT LONG TERM GOAL #1   Title Independent with an advanced  HEP.   Time 8   Period Weeks   Status Achieved     PT LONG TERM GOAL #2   Title Active left shoulder flexion to 145 degrees so the patient can easily reach overhead   Baseline 135 degrees actively in standing   Time 8   Period Weeks   Status On-going     PT LONG TERM GOAL #3   Title Increase ROM so patient is able to reach behind back to L3 with left hand.   Time 8   Period Weeks   Status Achieved     PT LONG TERM GOAL #4   Title Increase left shoulder strength to a solid 4+/5 to increase stability for performance of functional activities   Period Weeks   Status Achieved     PT LONG TERM GOAL #5   Title Perform ADL's with pain not > 2/10.   Baseline 0/10 during day; 4/10 at night   Time 8   Period Weeks   Status Partially Met               Plan - 03/19/17 1222    Clinical Impression Statement Patient did well with treatment today. Goals were assessed and he is still limited with flexion likely due to tight pecs and decreased mobility in thoracic spine. He has pain with PA mobs to L T4 and active TP at this level. He responded well and will benefit from further mobs. He reports no pain except at night.    Rehab Potential Good   Clinical Impairments Affecting Rehab Potential surgery 12/25/16 current 9 weeks 02/26/17   PT Frequency 3x / week   PT Duration 4 weeks   PT Treatment/Interventions ADLs/Self Care Home Management;Cryotherapy;Electrical Stimulation;Moist Heat;Ultrasound;Therapeutic activities;Therapeutic exercise;Neuromuscular re-education;Patient/family education;Passive range of motion;Vasopneumatic Device   PT Next Visit Plan Progress L RTC and shoulder girdle strengthening per Randall protocol. Continue with thoracic mobs, prone scapular strengthening.   PT Home Exercise Plan thoracic extension, corner stretch, wall push up   Consulted and Agree with Plan of Care Patient      Patient will benefit from skilled therapeutic intervention in order to  improve the following deficits and impairments:  Decreased activity tolerance, Decreased range of motion, Pain  Visit Diagnosis: Chronic left shoulder pain  Stiffness of left shoulder, not elsewhere classified  Muscle weakness (generalized)     Problem List Patient Active Problem List   Diagnosis Date Noted  . Hyperglycemia 06/12/2016  . Cardiomyopathy, ischemic 06/12/2016  .  DOE (dyspnea on exertion) 05/23/2015  . DJD (degenerative joint disease), lumbosacral 05/23/2015  . Angina, class III ([place]) 05/12/2013  . Dyslipidemia 04/29/2009  . CAD S/P percutaneous coronary angioplasty 04/29/2009    Madelyn Flavors PT 03/19/2017, 12:33 PM  [place], [place], [address] Phone: [phone]   Fax:  [phone]  Name: MAICO MULVEHILL MRN: [id number] Date of Birth: [birth year]

## 2017-03-23 ENCOUNTER — Ambulatory Visit: Payer: Medicare PPO

## 2017-03-23 DIAGNOSIS — M6281 Muscle weakness (generalized): Secondary | ICD-10-CM

## 2017-03-23 DIAGNOSIS — M25612 Stiffness of left shoulder, not elsewhere classified: Secondary | ICD-10-CM | POA: Diagnosis not present

## 2017-03-23 DIAGNOSIS — G8929 Other chronic pain: Secondary | ICD-10-CM

## 2017-03-23 DIAGNOSIS — M25512 Pain in left shoulder: Secondary | ICD-10-CM | POA: Diagnosis not present

## 2017-03-23 NOTE — Therapy (Signed)
[facility] [place], [place], [address] Phone: [phone]   Fax:  [phone]  Physical Therapy Treatment  Patient Details  Name: [name] MRN: [id number] Date of Birth: 1945/10/30 Referring Provider: Justice Britain MD  Encounter Date: 03/23/2017      PT End of Session - 03/23/17 0953    Visit Number 12   Number of Visits 24   Date for PT Re-Evaluation 03/29/17   PT Start Time 0946   PT Stop Time 1031   PT Time Calculation (min) 45 min   Activity Tolerance Patient tolerated treatment well   Behavior During Therapy [facility] for tasks assessed/performed      Past Medical History:  Diagnosis Date  . CAD in native artery    a. remote stenting to the RCA. b. inferior MI 2009 secondary to stent thrombosis s/p angioplasty to RCA. c. DES to PDA 05/2013.  Marland Kitchen Chronic neck pain   . Diabetes mellitus ([place])   . Hyperlipidemia   . Ischemic cardiomyopathy    a. EF 45% in 2009 at time of MI. b. Subsequent EFs normal.  . Myocardial infarct ([place]) 09-11-06, 2009  . Orthostatic hypotension     Past Surgical History:  Procedure Laterality Date  . CARDIAC CATHETERIZATION  10-04-08  . CERVICAL SPINE SURGERY    . CORONARY ANGIOPLASTY WITH STENT PLACEMENT  05/2013  . LEFT HEART CATHETERIZATION WITH CORONARY ANGIOGRAM N/A 05/11/2013   Procedure: LEFT HEART CATHETERIZATION WITH CORONARY ANGIOGRAM;  Surgeon: Blane Ohara, MD;  Location: [facility] CATH LAB;  Service: Cardiovascular;  Laterality: N/A;  . LUMBAR LAMINECTOMY/DECOMPRESSION MICRODISCECTOMY Left 04/11/2015   Procedure: LUMBAR LAMINECTOMY/DECOMPRESSION MICRODISCECTOMY;  Surgeon: Phylliss Bob, MD;  Location: [place];  Service: Orthopedics;  Laterality: Left;  Left sided lumbar 4-5 microdisectomy    There were no vitals filed for this visit.      Subjective Assessment - 03/23/17 0951    Subjective Pt. doing well with updated HEP without issue.     Patient Stated Goals Use my left arm  without pain.   Currently in Pain? No/denies   Pain Score 0-No pain   Multiple Pain Sites No                         [facility] Adult PT Treatment/Exercise - 03/23/17 0959      Neuro Re-ed    Neuro Re-ed Details  --     Shoulder Exercises: Supine   Horizontal ABduction Both;15 reps;Theraband   Theraband Level (Shoulder Horizontal ABduction) Level 2 (Red)   Horizontal ABduction Limitations Hooklying on bolster for posture   External Rotation Both;15 reps;Theraband   Theraband Level (Shoulder External Rotation) Level 1 (Yellow)  Hooklying on bolster for chest stretch + posture   Other Supine Exercises Hooklying on bolster alternating shoulder flexion/extension      Shoulder Exercises: Standing   External Rotation Strengthening;Left;20 reps;Theraband   Theraband Level (Shoulder External Rotation) Level 1 (Yellow)   Internal Rotation Strengthening;Left;20 reps;Theraband   Theraband Level (Shoulder Internal Rotation) Level 1 (Yellow)   Extension Strengthening;Left;Theraband;15 reps  3" hold    Theraband Level (Shoulder Extension) Level 3 (Green)   Extension Limitations 3" hold; tactile cues for scapular retraction   Row 15 reps;Theraband   Theraband Level (Shoulder Row) Level 3 (Green)   Row Limitations cues for scapular squeeze    Other Standing Exercises Wall slides x20 reps   Other Standing Exercises Standing wall slide flexion stretch 10 x  10"      Shoulder Exercises: ROM/Strengthening   Other ROM/Strengthening Exercises Body blade L shoulder IR/ER 2 x 30 sec      Shoulder Exercises: Stretch   Corner Stretch 3 reps;30 seconds   Corner Stretch Limitations in door in multi-positions                 PT Education - 03/23/17 1255    Education provided Yes   Education Details row with green TB, extension row with green TB issued to pt.   Person(s) Educated Patient   Methods Explanation;Demonstration;Verbal cues;Handout   Comprehension Verbalized  understanding;Returned demonstration;Verbal cues required;Need further instruction          PT Short Term Goals - 02/15/17 1025      PT SHORT TERM GOAL #1   Title independent with an initial HEP.   Time 3   Period Weeks   Status On-going     PT SHORT TERM GOAL #2   Title Restore full left shoulder PROM.   Time 3   Period Weeks   Status On-going           PT Long Term Goals - 03/19/17 0951      PT LONG TERM GOAL #1   Title Independent with an advanced HEP.   Time 8   Period Weeks   Status Achieved     PT LONG TERM GOAL #2   Title Active left shoulder flexion to 145 degrees so the patient can easily reach overhead   Baseline 135 degrees actively in standing   Time 8   Period Weeks   Status On-going     PT LONG TERM GOAL #3   Title Increase ROM so patient is able to reach behind back to L3 with left hand.   Time 8   Period Weeks   Status Achieved     PT LONG TERM GOAL #4   Title Increase left shoulder strength to a solid 4+/5 to increase stability for performance of functional activities   Period Weeks   Status Achieved     PT LONG TERM GOAL #5   Title Perform ADL's with pain not > 2/10.   Baseline 0/10 during day; 4/10 at night   Time 8   Period Weeks   Status Partially Met               Plan - 03/23/17 9767    Clinical Impression Statement Pt. doing well today noting no issues with updated HEP.  Treatment focusing on scapular strengthening, postural re-education, and RTC strengthening to pt. tolerance.  Pt. tolerated advancement in therex well today without complaint.  HEP updated to include additional scapular strengthening activities.   PT Next Visit Plan Progress L RTC and shoulder girdle strengthening per Smith protocol. Continue with thoracic mobs, prone scapular strengthening.      Patient will benefit from skilled therapeutic intervention in order to improve the following deficits and impairments:  Decreased activity tolerance,  Decreased range of motion, Pain  Visit Diagnosis: Chronic left shoulder pain  Stiffness of left shoulder, not elsewhere classified  Muscle weakness (generalized)     Problem List Patient Active Problem List   Diagnosis Date Noted  . Hyperglycemia 06/12/2016  . Cardiomyopathy, ischemic 06/12/2016  . DOE (dyspnea on exertion) 05/23/2015  . DJD (degenerative joint disease), lumbosacral 05/23/2015  . Angina, class III ([place]) 05/12/2013  . Dyslipidemia 04/29/2009  . CAD S/P percutaneous coronary angioplasty 04/29/2009    Bess Harvest, PTA  03/23/17 12:59 PM  [facility] [place], [place], [address] Phone: [phone]   Fax:  [phone]  Name: [name] MRN: [id number] Date of Birth: 09-09-1945

## 2017-03-25 ENCOUNTER — Ambulatory Visit: Payer: Medicare PPO | Admitting: *Deleted

## 2017-03-25 DIAGNOSIS — G8929 Other chronic pain: Secondary | ICD-10-CM | POA: Diagnosis not present

## 2017-03-25 DIAGNOSIS — M25512 Pain in left shoulder: Secondary | ICD-10-CM | POA: Diagnosis not present

## 2017-03-25 DIAGNOSIS — M25612 Stiffness of left shoulder, not elsewhere classified: Secondary | ICD-10-CM | POA: Diagnosis not present

## 2017-03-25 DIAGNOSIS — M6281 Muscle weakness (generalized): Secondary | ICD-10-CM | POA: Diagnosis not present

## 2017-03-25 NOTE — Therapy (Signed)
[facility] [place], [place], [address] Phone: [phone]   Fax:  [phone]  Physical Therapy Treatment  Patient Details  Name: [name] MRN: [id number] Date of Birth: 1945-12-24 Referring Provider: Justice Britain MD  Encounter Date: 03/25/2017      PT End of Session - 03/25/17 1207    Visit Number 13   Number of Visits 24   Date for PT Re-Evaluation 03/29/17   PT Start Time 1200   PT Stop Time 1251   PT Time Calculation (min) 51 min      Past Medical History:  Diagnosis Date  . CAD in native artery    a. remote stenting to the RCA. b. inferior MI 2009 secondary to stent thrombosis s/p angioplasty to RCA. c. DES to PDA 05/2013.  Marland Kitchen Chronic neck pain   . Diabetes mellitus ([place])   . Hyperlipidemia   . Ischemic cardiomyopathy    a. EF 45% in 2009 at time of MI. b. Subsequent EFs normal.  . Myocardial infarct ([place]) 09-11-06, 2009  . Orthostatic hypotension     Past Surgical History:  Procedure Laterality Date  . CARDIAC CATHETERIZATION  10-04-08  . CERVICAL SPINE SURGERY    . CORONARY ANGIOPLASTY WITH STENT PLACEMENT  05/2013  . LEFT HEART CATHETERIZATION WITH CORONARY ANGIOGRAM N/A 05/11/2013   Procedure: LEFT HEART CATHETERIZATION WITH CORONARY ANGIOGRAM;  Surgeon: Blane Ohara, MD;  Location: [facility] CATH LAB;  Service: Cardiovascular;  Laterality: N/A;  . LUMBAR LAMINECTOMY/DECOMPRESSION MICRODISCECTOMY Left 04/11/2015   Procedure: LUMBAR LAMINECTOMY/DECOMPRESSION MICRODISCECTOMY;  Surgeon: Phylliss Bob, MD;  Location: [place];  Service: Orthopedics;  Laterality: Left;  Left sided lumbar 4-5 microdisectomy    There were no vitals filed for this visit.      Subjective Assessment - 03/25/17 1205    Subjective Sore from last Rx, but doing ok   Pertinent History Severe left shoulder injury and surgery as a youyh.   Patient Stated Goals Use my left arm without pain.   Currently in Pain? No/denies                          [facility] Adult PT Treatment/Exercise - 03/25/17 0001      Exercises   Exercises Shoulder;Elbow     Elbow Exercises   Elbow Flexion Strengthening;Left;Bar weights/barbell  3x 10-15   Elbow Extension Strengthening;Standing  xts green 3x10-15     Shoulder Exercises: Prone   Retraction Strengthening;Left  3x10-15   Retraction Weight (lbs) 3   Extension Left;Strengthening  2# 3x 10-15   Extension Weight (lbs) 2     Shoulder Exercises: Sidelying   External Rotation Strengthening;Left  1# 3x10   External Rotation Weight (lbs) 1   Flexion Strengthening;Left  1#   3x10     Shoulder Exercises: Standing   External Rotation Strengthening;Left;20 reps;Theraband   Theraband Level (Shoulder External Rotation) Level 1 (Yellow)     Manual Therapy   Manual Therapy Passive ROM   Passive ROM PROM all motions in supine                  PT Short Term Goals - 02/15/17 1025      PT SHORT TERM GOAL #1   Title independent with an initial HEP.   Time 3   Period Weeks   Status On-going     PT SHORT TERM GOAL #2   Title Restore full left shoulder PROM.   Time  3   Period Weeks   Status On-going           PT Long Term Goals - 03/19/17 0951      PT LONG TERM GOAL #1   Title Independent with an advanced HEP.   Time 8   Period Weeks   Status Achieved     PT LONG TERM GOAL #2   Title Active left shoulder flexion to 145 degrees so the patient can easily reach overhead   Baseline 135 degrees actively in standing   Time 8   Period Weeks   Status On-going     PT LONG TERM GOAL #3   Title Increase ROM so patient is able to reach behind back to L3 with left hand.   Time 8   Period Weeks   Status Achieved     PT LONG TERM GOAL #4   Title Increase left shoulder strength to a solid 4+/5 to increase stability for performance of functional activities   Period Weeks   Status Achieved     PT LONG TERM GOAL #5   Title Perform ADL's with  pain not > 2/10.   Baseline 0/10 during day; 4/10 at night   Time 8   Period Weeks   Status Partially Met               Plan - 03/25/17 1214    Clinical Impression Statement Pt arrived today with mainly just mm soreness in LT shldr. He was able to perform all PREs with mainly fatigue. Rx focused on strengthening and ROM. He was able to reach PROM for flexion 145 degrees, and ER to 70 degrees   Rehab Potential Good   Clinical Impairments Affecting Rehab Potential surgery 12/25/16 current 9 weeks 02/26/17   PT Frequency 3x / week   PT Duration 4 weeks   PT Treatment/Interventions ADLs/Self Care Home Management;Cryotherapy;Electrical Stimulation;Moist Heat;Ultrasound;Therapeutic activities;Therapeutic exercise;Neuromuscular re-education;Patient/family education;Passive range of motion;Vasopneumatic Device   PT Next Visit Plan Progress L RTC and shoulder girdle strengthening per [place] protocol. Continue with thoracic mobs, prone scapular strengthening.   PT Home Exercise Plan thoracic extension, corner stretch, wall push up      Patient will benefit from skilled therapeutic intervention in order to improve the following deficits and impairments:  Decreased activity tolerance, Decreased range of motion, Pain  Visit Diagnosis: Chronic left shoulder pain  Stiffness of left shoulder, not elsewhere classified  Muscle weakness (generalized)     Problem List Patient Active Problem List   Diagnosis Date Noted  . Hyperglycemia 06/12/2016  . Cardiomyopathy, ischemic 06/12/2016  . DOE (dyspnea on exertion) 05/23/2015  . DJD (degenerative joint disease), lumbosacral 05/23/2015  . Angina, class III ([place]) 05/12/2013  . Dyslipidemia 04/29/2009  . CAD S/P percutaneous coronary angioplasty 04/29/2009    Rhilyn Battle,CHRIS, PTA 03/25/2017, 12:56 PM  [facility] [place], [place], [address] Phone: [phone]   Fax:   [phone]  Name: [name] MRN: [id number] Date of Birth: 1946-06-06

## 2017-03-30 ENCOUNTER — Encounter: Payer: Self-pay | Admitting: Physical Therapy

## 2017-03-30 ENCOUNTER — Ambulatory Visit: Payer: Medicare PPO | Admitting: Physical Therapy

## 2017-03-30 DIAGNOSIS — M6281 Muscle weakness (generalized): Secondary | ICD-10-CM

## 2017-03-30 DIAGNOSIS — M25512 Pain in left shoulder: Secondary | ICD-10-CM | POA: Diagnosis not present

## 2017-03-30 DIAGNOSIS — G8929 Other chronic pain: Secondary | ICD-10-CM | POA: Diagnosis not present

## 2017-03-30 DIAGNOSIS — M25612 Stiffness of left shoulder, not elsewhere classified: Secondary | ICD-10-CM | POA: Diagnosis not present

## 2017-03-30 NOTE — Therapy (Signed)
[facility] [place], [place], [address] Phone: [phone]   Fax:  [phone]  Physical Therapy Treatment  Patient Details  Name: [name] MRN: [id number] Date of Birth: [birth year] Referring Provider: Justice Britain MD  Encounter Date: 03/30/2017      PT End of Session - 03/30/17 0857    Visit Number 14   Number of Visits 24   Date for PT Re-Evaluation 03/29/17   PT Start Time 0859   PT Stop Time 0947   PT Time Calculation (min) 48 min   Activity Tolerance Patient tolerated treatment well   Behavior During Therapy [facility] for tasks assessed/performed      Past Medical History:  Diagnosis Date  . CAD in native artery    a. remote stenting to the RCA. b. inferior MI 2009 secondary to stent thrombosis s/p angioplasty to RCA. c. DES to PDA 05/2013.  Marland Kitchen Chronic neck pain   . Diabetes mellitus ([place])   . Hyperlipidemia   . Ischemic cardiomyopathy    a. EF 45% in 2009 at time of MI. b. Subsequent EFs normal.  . Myocardial infarct ([place]) 09-11-06, 2009  . Orthostatic hypotension     Past Surgical History:  Procedure Laterality Date  . CARDIAC CATHETERIZATION  10-04-08  . CERVICAL SPINE SURGERY    . CORONARY ANGIOPLASTY WITH STENT PLACEMENT  05/2013  . LEFT HEART CATHETERIZATION WITH CORONARY ANGIOGRAM N/A 05/11/2013   Procedure: LEFT HEART CATHETERIZATION WITH CORONARY ANGIOGRAM;  Surgeon: Blane Ohara, MD;  Location: [facility] CATH LAB;  Service: Cardiovascular;  Laterality: N/A;  . LUMBAR LAMINECTOMY/DECOMPRESSION MICRODISCECTOMY Left 04/11/2015   Procedure: LUMBAR LAMINECTOMY/DECOMPRESSION MICRODISCECTOMY;  Surgeon: Phylliss Bob, MD;  Location: [facility];  Service: Orthopedics;  Laterality: Left;  Left sided lumbar 4-5 microdisectomy    There were no vitals filed for this visit.      Subjective Assessment - 03/30/17 0857    Subjective Reports shoulder soreness but is completing strengthening exercises at home.   Pertinent  History Severe left shoulder injury and surgery as a youyh.   Patient Stated Goals Use my left arm without pain.   Currently in Pain? Yes   Pain Score 2    Pain Location Shoulder   Pain Orientation Left   Pain Descriptors / Indicators Sore   Pain Type Surgical pain   Pain Onset More than a month ago            [facility] PT Assessment - 03/30/17 0001      Assessment   Medical Diagnosis Left shoulder arthroscopic surgery; RTC repair.   Onset Date/Surgical Date 12/25/16   Next MD Visit 04/07/2017     Restrictions   Weight Bearing Restrictions No                     [facility] Adult PT Treatment/Exercise - 03/30/17 0001      Elbow Exercises   Elbow Flexion Strengthening;Left;Other reps (comment);Standing;Bar weights/barbell   Bar Weights/Barbell (Elbow Flexion) 4 lbs   Elbow Flexion Limitations 3x10 reps   Elbow Extension Strengthening;Both;Other reps (comment);Standing   Elbow Extension Limitations Green XTS 3x10 reps     Shoulder Exercises: Prone   Retraction Strengthening;Left;Weights   Retraction Weight (lbs) 3   Retraction Limitations 3x10 reps   Extension Strengthening;Left;Weights  3x10 rpes   Extension Weight (lbs) 2   Horizontal ABduction 1 Strengthening;Left;20 reps;Weights   Horizontal ABduction 1 Weight (lbs) 2     Shoulder Exercises: Sidelying  External Rotation Strengthening;Left;Weights   External Rotation Weight (lbs) 1   External Rotation Limitations 3x10 reps   Flexion Strengthening;Left;Weights  3x10 reps   Flexion Weight (lbs) 1     Shoulder Exercises: Standing   Flexion Strengthening;Both;Weights   Shoulder Flexion Weight (lbs) 1   Flexion Limitations 3x10 reps   Other Standing Exercises B shoulder scaption 1# 3x10 reps     Shoulder Exercises: ROM/Strengthening   UBE (Upper Arm Bike) 90 RPM x5 min     Modalities   Modalities Electrical Stimulation;Vasopneumatic     Electrical Stimulation   Electrical Stimulation Location L shoulder    Electrical Stimulation Action IFC   Electrical Stimulation Parameters 1-10 hz x15 min   Electrical Stimulation Goals Pain     Vasopneumatic   Number Minutes Vasopneumatic  15 minutes   Vasopnuematic Location  Shoulder   Vasopneumatic Pressure Low   Vasopneumatic Temperature  34                  PT Short Term Goals - 03/30/17 0932      PT SHORT TERM GOAL #1   Title independent with an initial HEP.   Time 3   Period Weeks   Status Achieved     PT SHORT TERM GOAL #2   Title Restore full left shoulder PROM.   Time 3   Period Weeks   Status On-going           PT Long Term Goals - 03/30/17 0932      PT LONG TERM GOAL #1   Title Independent with an advanced HEP.   Time 8   Period Weeks   Status Achieved     PT LONG TERM GOAL #2   Title Active left shoulder flexion to 145 degrees so the patient can easily reach overhead   Baseline 135 degrees actively in standing   Time 8   Period Weeks   Status On-going     PT LONG TERM GOAL #3   Title Increase ROM so patient is able to reach behind back to L3 with left hand.   Time 8   Period Weeks   Status Achieved     PT LONG TERM GOAL #4   Title Increase left shoulder strength to a solid 4+/5 to increase stability for performance of functional activities   Period Weeks   Status Achieved     PT LONG TERM GOAL #5   Title Perform ADL's with pain not > 2/10.   Baseline 0/10 during day; 4/10 at night   Time 8   Period Weeks   Status Partially Met  Aches the most rest per patient report 03/30/2017               Plan - 03/30/17 0933    Clinical Impression Statement Patient tolerated today's well with only minimal L shoulder soreness upon arrival today. Patient able to progress through L shoulder strengthening exercises in all planes and varying resistance with only complaint of fatigue with exercises. Minor compensatory strategy of trunk rotation and involvement with prone L shoulder horizontal abduction.  Normal modalities response noted following removal of the modalities.   Rehab Potential Good   Clinical Impairments Affecting Rehab Potential surgery 12/25/16 current 9 weeks 02/26/17   PT Frequency 3x / week   PT Duration 4 weeks   PT Treatment/Interventions ADLs/Self Care Home Management;Cryotherapy;Electrical Stimulation;Moist Heat;Ultrasound;Therapeutic activities;Therapeutic exercise;Neuromuscular re-education;Patient/family education;Passive range of motion;Vasopneumatic Device   PT Next Visit Plan Progress L RTC and  shoulder girdle strengthening per [place] protocol. Continue with thoracic mobs, prone scapular strengthening. MD note required for 04/06/2017 appointment.   PT Home Exercise Plan thoracic extension, corner stretch, wall push up   Consulted and Agree with Plan of Care Patient      Patient will benefit from skilled therapeutic intervention in order to improve the following deficits and impairments:  Decreased activity tolerance, Decreased range of motion, Pain  Visit Diagnosis: Chronic left shoulder pain  Stiffness of left shoulder, not elsewhere classified  Muscle weakness (generalized)     Problem List Patient Active Problem List   Diagnosis Date Noted  . Hyperglycemia 06/12/2016  . Cardiomyopathy, ischemic 06/12/2016  . DOE (dyspnea on exertion) 05/23/2015  . DJD (degenerative joint disease), lumbosacral 05/23/2015  . Angina, class III ([place]) 05/12/2013  . Dyslipidemia 04/29/2009  . CAD S/P percutaneous coronary angioplasty 04/29/2009    Ahmed Prima, PTA 03/30/17 9:49 AM  [facility] [place], [place], [address] Phone: [phone]   Fax:  [phone]  Name: [name] MRN: [id number] Date of Birth: [birth year]

## 2017-04-01 ENCOUNTER — Encounter: Payer: Self-pay | Admitting: Physical Therapy

## 2017-04-01 ENCOUNTER — Ambulatory Visit: Payer: Medicare PPO | Attending: Orthopedic Surgery | Admitting: Physical Therapy

## 2017-04-01 DIAGNOSIS — M25612 Stiffness of left shoulder, not elsewhere classified: Secondary | ICD-10-CM | POA: Insufficient documentation

## 2017-04-01 DIAGNOSIS — M6281 Muscle weakness (generalized): Secondary | ICD-10-CM | POA: Diagnosis not present

## 2017-04-01 DIAGNOSIS — M25512 Pain in left shoulder: Secondary | ICD-10-CM | POA: Diagnosis not present

## 2017-04-01 DIAGNOSIS — G8929 Other chronic pain: Secondary | ICD-10-CM | POA: Diagnosis not present

## 2017-04-01 NOTE — Therapy (Addendum)
[facility] [place], [place], [address] Phone: [phone]   Fax:  [phone]  Physical Therapy Treatment  Patient Details  Name: [name] MRN: [id number] Date of Birth: 06-09-1946 Referring Provider: Justice Britain MD  Encounter Date: 04/01/2017      PT End of Session - 04/01/17 1356    Visit Number 15   Number of Visits 24   Date for PT Re-Evaluation 04/26/17   PT Start Time 1347   PT Stop Time 1435   PT Time Calculation (min) 48 min   Activity Tolerance Patient tolerated treatment well   Behavior During Therapy [facility] for tasks assessed/performed      Past Medical History:  Diagnosis Date  . CAD in native artery    a. remote stenting to the RCA. b. inferior MI 2009 secondary to stent thrombosis s/p angioplasty to RCA. c. DES to PDA 05/2013.  Marland Kitchen Chronic neck pain   . Diabetes mellitus ([place])   . Hyperlipidemia   . Ischemic cardiomyopathy    a. EF 45% in 2009 at time of MI. b. Subsequent EFs normal.  . Myocardial infarct ([place]) 09-11-06, 2009  . Orthostatic hypotension     Past Surgical History:  Procedure Laterality Date  . CARDIAC CATHETERIZATION  10-04-08  . CERVICAL SPINE SURGERY    . CORONARY ANGIOPLASTY WITH STENT PLACEMENT  05/2013  . LEFT HEART CATHETERIZATION WITH CORONARY ANGIOGRAM N/A 05/11/2013   Procedure: LEFT HEART CATHETERIZATION WITH CORONARY ANGIOGRAM;  Surgeon: Blane Ohara, MD;  Location: [facility] CATH LAB;  Service: Cardiovascular;  Laterality: N/A;  . LUMBAR LAMINECTOMY/DECOMPRESSION MICRODISCECTOMY Left 04/11/2015   Procedure: LUMBAR LAMINECTOMY/DECOMPRESSION MICRODISCECTOMY;  Surgeon: Phylliss Bob, MD;  Location: [facility];  Service: Orthopedics;  Laterality: Left;  Left sided lumbar 4-5 microdisectomy    There were no vitals filed for this visit.      Subjective Assessment - 04/01/17 1356    Subjective Reports a little ache in L shoulder.   Pertinent History Severe left shoulder injury and  surgery as a youyh.   Patient Stated Goals Use my left arm without pain.   Currently in Pain? Yes   Pain Score 2    Pain Location Shoulder   Pain Orientation Left   Pain Descriptors / Indicators Aching   Pain Type Surgical pain   Pain Onset More than a month ago            [facility] PT Assessment - 04/01/17 0001      Assessment   Medical Diagnosis Left shoulder arthroscopic surgery; RTC repair.   Onset Date/Surgical Date 12/25/16   Next MD Visit 04/07/2017     Restrictions   Weight Bearing Restrictions No     ROM / Strength   AROM / PROM / Strength AROM;Strength     AROM   Overall AROM  Within functional limits for tasks performed   AROM Assessment Site Shoulder   Right/Left Shoulder Left   Left Shoulder Flexion 130 Degrees   Left Shoulder Internal Rotation 60 Degrees   Left Shoulder External Rotation 60 Degrees     Strength   Overall Strength Deficits   Strength Assessment Site Shoulder   Right/Left Shoulder Left   Left Shoulder Flexion 4+/5   Left Shoulder ABduction 4/5   Left Shoulder Internal Rotation 5/5   Left Shoulder External Rotation 5/5                     [facility] Adult PT Treatment/Exercise -  04/01/17 0001      Shoulder Exercises: Prone   Retraction Strengthening;Left;Weights   Retraction Weight (lbs) 2   Retraction Limitations 3x10 reps   Extension Strengthening;Left;Weights  3x10 reps   Extension Weight (lbs) 2     Shoulder Exercises: Sidelying   External Rotation Strengthening;Left;Weights   External Rotation Weight (lbs) 1   External Rotation Limitations 3x10 reps     Shoulder Exercises: Standing   External Rotation Strengthening;Left;Theraband  3x10 reps   Theraband Level (Shoulder External Rotation) Level 2 (Red)   Internal Rotation Strengthening;Left;Theraband   Theraband Level (Shoulder Internal Rotation) Level 2 (Red)   Internal Rotation Limitations 3x10 reps   Flexion Strengthening;Both;Weights   Shoulder Flexion Weight  (lbs) 1   Flexion Limitations 3x10 reps   ABduction Strengthening;Left;Weights   Shoulder ABduction Weight (lbs) 2   ABduction Limitations 3x10 reps   Extension Strengthening;Left;Theraband   Theraband Level (Shoulder Extension) Level 2 (Red)   Extension Limitations 3x10 reps   Row Strengthening;Left;Theraband   Theraband Level (Shoulder Row) Level 2 (Red)   Row Limitations 3x10 reps   Other Standing Exercises L shoulder D1/D2/OH lift x20 reps 2#   Other Standing Exercises Wall slide with lift off x20 reps     Shoulder Exercises: ROM/Strengthening   UBE (Upper Arm Bike) 90 RPM x5 min     Modalities   Modalities Electrical Stimulation;Vasopneumatic     Electrical Stimulation   Electrical Stimulation Location L shoulder   Electrical Stimulation Action IFC   Electrical Stimulation Parameters 1-10 hz x15 min   Electrical Stimulation Goals Pain     Vasopneumatic   Number Minutes Vasopneumatic  15 minutes   Vasopnuematic Location  Shoulder   Vasopneumatic Pressure Low   Vasopneumatic Temperature  46                  PT Short Term Goals - 04/01/17 1438      PT SHORT TERM GOAL #1   Title independent with an initial HEP.   Time 3   Period Weeks   Status Achieved     PT SHORT TERM GOAL #2   Title Restore full left shoulder PROM.   Time 3   Period Weeks   Status Achieved           PT Long Term Goals - 04/01/17 1438      PT LONG TERM GOAL #1   Title Independent with an advanced HEP.   Time 8   Period Weeks   Status Achieved     PT LONG TERM GOAL #2   Title Active left shoulder flexion to 145 degrees so the patient can easily reach overhead   Baseline 135 degrees actively in standing   Time 8   Period Weeks   Status On-going  AROM L shoulder 130 deg 04/01/2017     PT LONG TERM GOAL #3   Title Increase ROM so patient is able to reach behind back to L3 with left hand.   Time 8   Period Weeks   Status Achieved     PT LONG TERM GOAL #4   Title  Increase left shoulder strength to a solid 4+/5 to increase stability for performance of functional activities   Period Weeks   Status Achieved     PT LONG TERM GOAL #5   Title Perform ADL's with pain not > 2/10.   Baseline 0/10 during day; 4/10 at night   Time 8   Period Weeks   Status Partially Met  Aches the most rest per patient report 03/30/2017               Plan - 04/01/17 1416    Clinical Impression Statement Patient tolerated today's treatment well today as he arrived with low level L shoulder ache. Patient able to progress through L shoulder strengthening with varying resistance with only complaint of muscle fatigue especially with standing flexion and abduction. Normal modalities response noted following removal of the modalities. Able to achieve all goals at this time other than L shoulder flexion ROM goal.   Rehab Potential Good   Clinical Impairments Affecting Rehab Potential surgery 12/25/16 current 9 weeks 02/26/17   PT Frequency 3x / week   PT Duration 4 weeks   PT Treatment/Interventions ADLs/Self Care Home Management;Cryotherapy;Electrical Stimulation;Moist Heat;Ultrasound;Therapeutic activities;Therapeutic exercise;Neuromuscular re-education;Patient/family education;Passive range of motion;Vasopneumatic Device   PT Next Visit Plan Progress L RTC and shoulder girdle strengthening per [place] protocol. Continue with thoracic mobs, prone scapular strengthening. MD note required for 04/06/2017 appointment.   PT Home Exercise Plan thoracic extension, corner stretch, wall push up   Consulted and Agree with Plan of Care Patient      Patient will benefit from skilled therapeutic intervention in order to improve the following deficits and impairments:  Decreased activity tolerance, Decreased range of motion, Pain  Visit Diagnosis: Chronic left shoulder pain  Stiffness of left shoulder, not elsewhere classified  Muscle weakness (generalized)     Problem  List Patient Active Problem List   Diagnosis Date Noted  . Hyperglycemia 06/12/2016  . Cardiomyopathy, ischemic 06/12/2016  . DOE (dyspnea on exertion) 05/23/2015  . DJD (degenerative joint disease), lumbosacral 05/23/2015  . Angina, class III ([place]) 05/12/2013  . Dyslipidemia 04/29/2009  . CAD S/P percutaneous coronary angioplasty 04/29/2009    Ahmed Prima, PTA 04/01/17 2:45 PM Mali Applegate MPT [facility] [address] [place], [place], [address] Phone: [phone]   Fax:  [phone]  Name: [name] MRN: [id number] Date of Birth: [birth year]  PHYSICAL THERAPY DISCHARGE SUMMARY  Visits from Start of Care: 15.  Current functional level related to goals / functional outcomes: See above.   Remaining deficits: See goal section.   Education / Equipment: HEP. Plan: Patient agrees to discharge.  Patient goals were not met. Patient is being discharged due to being pleased with the current functional level.  ?????         Mali Applegate MPT

## 2017-04-05 DIAGNOSIS — G8929 Other chronic pain: Secondary | ICD-10-CM | POA: Diagnosis not present

## 2017-04-05 DIAGNOSIS — M25512 Pain in left shoulder: Secondary | ICD-10-CM | POA: Diagnosis not present

## 2017-04-05 DIAGNOSIS — Z4789 Encounter for other orthopedic aftercare: Secondary | ICD-10-CM | POA: Diagnosis not present

## 2017-05-31 ENCOUNTER — Other Ambulatory Visit: Payer: Self-pay | Admitting: Cardiovascular Disease

## 2017-06-12 ENCOUNTER — Other Ambulatory Visit: Payer: Self-pay | Admitting: Cardiovascular Disease

## 2017-07-12 ENCOUNTER — Other Ambulatory Visit: Payer: Self-pay | Admitting: Cardiovascular Disease

## 2017-08-19 ENCOUNTER — Other Ambulatory Visit: Payer: Self-pay | Admitting: Cardiovascular Disease

## 2017-08-22 ENCOUNTER — Other Ambulatory Visit: Payer: Self-pay | Admitting: Cardiovascular Disease

## 2017-09-03 ENCOUNTER — Ambulatory Visit: Payer: Medicare Other | Admitting: Cardiovascular Disease

## 2017-09-03 ENCOUNTER — Encounter: Payer: Self-pay | Admitting: Cardiovascular Disease

## 2017-09-03 VITALS — BP 134/82 | HR 78 | Ht 72.0 in | Wt 164.4 lb

## 2017-09-03 DIAGNOSIS — E785 Hyperlipidemia, unspecified: Secondary | ICD-10-CM

## 2017-09-03 DIAGNOSIS — I2510 Atherosclerotic heart disease of native coronary artery without angina pectoris: Secondary | ICD-10-CM

## 2017-09-03 NOTE — Patient Instructions (Addendum)

## 2017-09-03 NOTE — Progress Notes (Signed)
Cardiology Office Note Date:  09/03/2017   ID:  [name], DOB 07/19/1946, MRN [id number]  PCP:  Jack Rua, MD  Cardiologist:  Jack Bollman, MD    Chief Complaint  Patient presents with  . Coronary Artery Disease     History of Present Illness: [name] is a 72 y.o. male who presents for follow-up of coronary artery disease.  He underwent remote stenting of the right coronary artery.  He presented with an acute inferior MI in 2009 caused by very late stent thrombosis.  He underwent repeat stenting in 2014 when he presented with progressive exertional dyspnea.  He is done quite well since that time.  His last nuclear stress test in 2017 demonstrated no ischemia.  From a symptomatic perspective, he is doing well.  He denies chest pain, chest pressure, or shortness of breath.  No edema, heart palpitations, lightheadedness, or syncope.   Past Medical History:  Diagnosis Date  . CAD in native artery    a. remote stenting to the RCA. b. inferior MI 2009 secondary to stent thrombosis s/p angioplasty to RCA. c. DES to PDA 05/2013.  Marland Kitchen Chronic neck pain   . Diabetes mellitus ([facility])   . Hyperlipidemia   . Ischemic cardiomyopathy    a. EF 45% in 2009 at time of MI. b. Subsequent EFs normal.  . Myocardial infarct ([facility]) 09-11-06, 2009  . Orthostatic hypotension     Past Surgical History:  Procedure Laterality Date  . CARDIAC CATHETERIZATION  10-04-08  . CERVICAL SPINE SURGERY    . CORONARY ANGIOPLASTY WITH STENT PLACEMENT  05/2013  . LEFT HEART CATHETERIZATION WITH CORONARY ANGIOGRAM N/A 05/11/2013   Procedure: LEFT HEART CATHETERIZATION WITH CORONARY ANGIOGRAM;  Surgeon: Micheline Chapman, MD;  Location: [facility] CATH LAB;  Service: Cardiovascular;  Laterality: N/A;  . LUMBAR LAMINECTOMY/DECOMPRESSION MICRODISCECTOMY Left 04/11/2015   Procedure: LUMBAR LAMINECTOMY/DECOMPRESSION MICRODISCECTOMY;  Surgeon: Estill Bamberg, MD;  Location: [facility] OR;  Service: Orthopedics;  Laterality: Left;  Left  sided lumbar 4-5 microdisectomy    Current Outpatient Medications  Medication Sig Dispense Refill  . acetaminophen (TYLENOL) 650 MG CR tablet Take 650 mg by mouth every 8 (eight) hours as needed for pain.    Marland Kitchen aspirin 81 MG tablet Take 1 tablet (81 mg total) by mouth daily. 1 tablet 0  . clopidogrel (PLAVIX) 75 MG tablet TAKE ONE TABLET BY MOUTH ONCE DAILY 90 tablet 0  . metFORMIN (GLUCOPHAGE) 500 MG tablet Take by mouth 2 (two) times daily with a meal.    . niacin (NIASPAN) 750 MG CR tablet Take 1 tablet (750 mg total) by mouth at bedtime. 90 tablet 1  . nitroGLYCERIN (NITROSTAT) 0.4 MG SL tablet Place 1 tablet (0.4 mg total) under the tongue every 5 (five) minutes as needed for chest pain. 25 tablet 3  . Omega-3 Fatty Acids (OMEGA-3 FISH OIL PO) Take 2.4 g by mouth daily.     . simvastatin (ZOCOR) 40 MG tablet Take 0.5 tablets (20 mg total) daily at 6 PM by mouth. Please keep upcoming appt in January for future refills. Thank you 45 tablet 0   No current facility-administered medications for this visit.     Allergies:   Beta adrenergic blockers   Social History:  The patient  reports that he has quit smoking. he has never used smokeless tobacco. He reports that he does not drink alcohol or use drugs.   Family History:  The patient's family history includes Coronary artery disease in  his unknown relative; Heart attack in his brother; Heart attack (age of onset: 3263) in his father; Heart failure in his mother.   ROS:  Please see the history of present illness.  Otherwise, review of systems is positive for back pain.  All other systems are reviewed and negative.   PHYSICAL EXAM: VS:  BP 134/82   Pulse 78   Ht 6' (1.829 m)   Wt 164 lb 6.4 oz (74.6 kg)   BMI 22.30 kg/m  , BMI Body mass index is 22.3 kg/m. GEN: Well nourished, well developed, in no acute distress  HEENT: normal  Neck: no JVD, no masses. No carotid bruits Cardiac: RRR without murmur or gallop     Respiratory:  clear to  auscultation bilaterally, normal work of breathing GI: soft, nontender, nondistended, + BS MS: no deformity or atrophy  Ext: no pretibial edema, pedal pulses 2+= bilaterally Skin: warm and dry, no rash Neuro:  Strength and sensation are intact Psych: euthymic mood, full affect  EKG:  EKG is ordered today. The ekg ordered today shows NSR 78 bpm, age-indeterminate inferior infarct, otherwise within normal limits  Recent Labs: No results found for requested labs within last 8760 hours.   Lipid Panel     Component Value Date/Time   CHOL 127 09/25/2014 0830   TRIG 88.0 09/25/2014 0830   HDL 40.90 09/25/2014 0830   CHOLHDL 3 09/25/2014 0830   VLDL 17.6 09/25/2014 0830   LDLCALC 69 09/25/2014 0830      Wt Readings from Last 3 Encounters:  09/03/17 164 lb 6.4 oz (74.6 kg)  07/06/16 160 lb 1.9 oz (72.6 kg)  06/30/16 163 lb (73.9 kg)     Cardiac Studies Reviewed: Myocardial perfusion study 06/25/2016: Study Highlights     Nuclear stress EF: 53%.  There was no ST segment deviation noted during stress.  Defect 1: There is a large defect of moderate severity present in the basal inferior, mid inferior and apical inferior location.  This is a low risk study.  The left ventricular ejection fraction is mildly decreased (45-54%).  Low risk stress nuclear study with significant diaphragmatic attenuation vs prior inferior MI and minimal inferior ischemia; EF 53 with mild global hypokinesis.   ASSESSMENT AND PLAN: 1.  CAD, native vessel, without angina: pt is active without cardiopulmonary limitation. Medications are reviewed and will be continued without changes.  He is treated with aspirin, clopidogrel, niacin, and simvastatin.  He has been maintained on long-term dual antiplatelet therapy because of a history of very late stent thrombosis.  2.  Hyperlipidemia: Treated with simvastatin and Niaspan.  Last lipids from 1 year ago demonstrated a cholesterol of 124, HDL 42, and LDL  68.  His ALT is normal at 24.  3.  Type 2 diabetes: Treated by his primary physician with metformin.  States his last hemoglobin A1c was 6.1.  Current medicines are reviewed with the patient today.  The patient does not have concerns regarding medicines.  Labs/ tests ordered today include:  No orders of the defined types were placed in this encounter.   Disposition:   FU one year  Signed, Jack BollmanMichael Rollo Farquhar, MD  09/03/2017 3:09 PM    [facility] [address], [place], [place]  [address] Phone: [phone]; Fax: [phone]

## 2017-11-17 ENCOUNTER — Other Ambulatory Visit: Payer: Self-pay | Admitting: Cardiovascular Disease

## 2018-08-25 ENCOUNTER — Other Ambulatory Visit: Payer: Self-pay | Admitting: Cardiovascular Disease

## 2018-09-19 ENCOUNTER — Other Ambulatory Visit: Payer: Self-pay | Admitting: Cardiovascular Disease

## 2018-09-27 ENCOUNTER — Encounter: Payer: Self-pay | Admitting: Cardiovascular Disease

## 2018-09-28 ENCOUNTER — Encounter: Payer: Self-pay | Admitting: Cardiovascular Disease

## 2018-09-28 ENCOUNTER — Ambulatory Visit: Payer: Medicare Other | Admitting: Cardiovascular Disease

## 2018-09-28 VITALS — BP 126/72 | HR 65 | Ht 72.0 in | Wt 156.6 lb

## 2018-09-28 DIAGNOSIS — I25119 Atherosclerotic heart disease of native coronary artery with unspecified angina pectoris: Secondary | ICD-10-CM | POA: Diagnosis not present

## 2018-09-28 DIAGNOSIS — E782 Mixed hyperlipidemia: Secondary | ICD-10-CM | POA: Diagnosis not present

## 2018-09-28 DIAGNOSIS — R0602 Shortness of breath: Secondary | ICD-10-CM

## 2018-09-28 DIAGNOSIS — R002 Palpitations: Secondary | ICD-10-CM | POA: Diagnosis not present

## 2018-09-28 NOTE — Progress Notes (Signed)
Cardiology Office Note:    Date:  09/28/2018   ID:  Jack ModeJohn R Cajuste, DOB 06/10/1946, MRN [id number]  PCP:  Joycelyn RuaMeyers, Stephen, MD  Cardiologist:  Tonny BollmanMichael Khyleigh Furney, MD  Electrophysiologist:  None   Referring MD: Joycelyn RuaMeyers, Stephen, MD   Chief Complaint  Patient presents with  . Palpitations    History of Present Illness:    [name] is a 73 y.o. male with a hx of coronary artery disease, presenting for follow-up evaluation.  The patient underwent remote stenting of the RCA and then later presented with an acute inferior MI in 2009 caused by very late stent thrombosis.  He underwent repeat stenting in 2014 when he presented with worsening exertional dyspnea.  His last nuclear stress test was in 2017 and it demonstrated no ischemia.  The patient is here alone today.  He has had some episodes of heart palpitations occurring at rest with associated dizziness.  He is also had intermittent left upper arm pain, not consistently associated with physical exertion.  This feels like an ache.  He has exertional dyspnea with moderate level activity such as carrying firewood a short distance.  He denies orthopnea, PND, or syncope.  Past Medical History:  Diagnosis Date  . CAD in native artery    a. remote stenting to the RCA. b. inferior MI 2009 secondary to stent thrombosis s/p angioplasty to RCA. c. DES to PDA 05/2013.  Marland Kitchen. Chronic neck pain   . Diabetes mellitus (HCC)   . Hyperlipidemia   . Ischemic cardiomyopathy    a. EF 45% in 2009 at time of MI. b. Subsequent EFs normal.  . Myocardial infarct (HCC) 09-11-06, 2009  . Orthostatic hypotension     Past Surgical History:  Procedure Laterality Date  . CARDIAC CATHETERIZATION  10-04-08  . CERVICAL SPINE SURGERY    . CORONARY ANGIOPLASTY WITH STENT PLACEMENT  05/2013  . LEFT HEART CATHETERIZATION WITH CORONARY ANGIOGRAM N/A 05/11/2013   Procedure: LEFT HEART CATHETERIZATION WITH CORONARY ANGIOGRAM;  Surgeon: Micheline ChapmanMichael D Jewelz Kobus, MD;  [facility];   Service: Cardiovascular;  Laterality: N/A;  . LUMBAR LAMINECTOMY/DECOMPRESSION MICRODISCECTOMY Left 04/11/2015   Procedure: LUMBAR LAMINECTOMY/DECOMPRESSION MICRODISCECTOMY;  Surgeon: Estill BambergMark Dumonski, MD;  Location: [facility] OR;  Service: Orthopedics;  Laterality: Left;  Left sided lumbar 4-5 microdisectomy    Current Medications: Current Meds  Medication Sig  . acetaminophen (TYLENOL) 650 MG CR tablet Take 650 mg by mouth every 8 (eight) hours as needed for pain.  Marland Kitchen. aspirin 81 MG tablet Take 1 tablet (81 mg total) by mouth daily.  . clopidogrel (PLAVIX) 75 MG tablet Take 1 tablet (75 mg total) by mouth daily. Please keep upcoming appt for future refills. Thank you.  . metFORMIN (GLUCOPHAGE) 500 MG tablet Take by mouth 2 (two) times daily with a meal.  . niacin (NIASPAN) 750 MG CR tablet Take 1 tablet (750 mg total) by mouth at bedtime.  . nitroGLYCERIN (NITROSTAT) 0.4 MG SL tablet Place 1 tablet (0.4 mg total) under the tongue every 5 (five) minutes as needed for chest pain.  . Omega-3 Fatty Acids (OMEGA-3 FISH OIL PO) Take 2.4 g by mouth daily.   . simvastatin (ZOCOR) 20 MG tablet Take 20 mg by mouth daily.     Allergies:   Beta adrenergic blockers   Social History   Socioeconomic History  . Marital status: Married    Spouse name: Not on file  . Number of children: 1  . Years of education: Not on file  .  Highest education level: Not on file  Occupational History  . Occupation: retired Energy manager: RETIRED  Social Needs  . Financial resource strain: Not on file  . Food insecurity:    Worry: Not on file    Inability: Not on file  . Transportation needs:    Medical: Not on file    Non-medical: Not on file  Tobacco Use  . Smoking status: Former Games developer  . Smokeless tobacco: Never Used  Substance and Sexual Activity  . Alcohol use: No  . Drug use: No  . Sexual activity: Not Currently  Lifestyle  . Physical activity:    Days per week: Not on file    Minutes per  session: Not on file  . Stress: Not on file  Relationships  . Social connections:    Talks on phone: Not on file    Gets together: Not on file    Attends religious service: Not on file    Active member of club or organization: Not on file    Attends meetings of clubs or organizations: Not on file    Relationship status: Not on file  Other Topics Concern  . Not on file  Social History Narrative  . Not on file     Family History: The patient's family history includes Coronary artery disease in an other family member; Heart attack in his brother; Heart attack (age of onset: 86) in his father; Heart failure in his mother. There is no history of Hypertension or Stroke.  ROS:   Please see the history of present illness.    Positive for back pain, exertional dyspnea, easy bruising, leg swelling.  All other systems reviewed and are negative.  EKGs/Labs/Other Studies Reviewed:    The following studies were reviewed today: Myoview stress test 06/25/2016: Study Highlights     Nuclear stress EF: 53%.  There was no ST segment deviation noted during stress.  Defect 1: There is a large defect of moderate severity present in the basal inferior, mid inferior and apical inferior location.  This is a low risk study.  The left ventricular ejection fraction is mildly decreased (45-54%).   Low risk stress nuclear study with significant diaphragmatic attenuation vs prior inferior MI and minimal inferior ischemia; EF 53 with mild global hypokinesis.   EKG:  EKG is ordered today.  The ekg ordered today demonstrates normal sinus rhythm 65 bpm, nonspecific ST and T wave abnormality.  Recent Labs: No results found for requested labs within last 8760 hours.  Recent Lipid Panel    Component Value Date/Time   CHOL 127 09/25/2014 0830   TRIG 88.0 09/25/2014 0830   HDL 40.90 09/25/2014 0830   CHOLHDL 3 09/25/2014 0830   VLDL 17.6 09/25/2014 0830   LDLCALC 69 09/25/2014 0830    Physical  Exam:    VS:  BP 126/72   Pulse 65   Ht 6' (1.829 m)   Wt 156 lb 9.6 oz (71 kg)   SpO2 97%   BMI 21.24 kg/m     Wt Readings from Last 3 Encounters:  09/28/18 156 lb 9.6 oz (71 kg)  09/03/17 164 lb 6.4 oz (74.6 kg)  07/06/16 160 lb 1.9 oz (72.6 kg)     GEN:  Well nourished, well developed in no acute distress HEENT: Normal NECK: No JVD; No carotid bruits LYMPHATICS: No lymphadenopathy CARDIAC: RRR, no murmurs, rubs, gallops RESPIRATORY:  Clear to auscultation without rales, wheezing or rhonchi  ABDOMEN: Soft, non-tender, non-distended  MUSCULOSKELETAL:  No edema; No deformity  SKIN: Warm and dry NEUROLOGIC:  Alert and oriented x 3 PSYCHIATRIC:  Normal affect   ASSESSMENT:    1. Coronary artery disease involving native coronary artery of native heart with angina pectoris (HCC)   2. Mixed hyperlipidemia   3. Palpitations   4. Shortness of breath on exertion    PLAN:    In order of problems listed above:  1. The patient has a variety of symptoms, unclear if his exertional dyspnea or left upper arm pain or anginal equivalents.  His last stress test was 3 years ago.  I recommended a repeat exercise Myoview stress scan.  He will otherwise continue his current medical program.  He is maintained on long-term DAPT after a history of stent thrombosis remotely. 2. Lipids are now followed by his primary physician.  He is treated with simvastatin and niacin at present.  He reports excellent lipid readings. 3. I am going to check an event monitor to further evaluate his heart palpitations.  He is been intolerant to beta-blockers in the past. 4. His cardiopulmonary exam is normal.  Again we will check a stress Myoview to evaluate for an ischemic equivalent.   Medication Adjustments/Labs and Tests Ordered: Current medicines are reviewed at length with the patient today.  Concerns regarding medicines are outlined above.  Orders Placed This Encounter  Procedures  . LONG TERM MONITOR  (3-14 DAYS)  . MYOCARDIAL PERFUSION IMAGING  . EKG 12-Lead   No orders of the defined types were placed in this encounter.   Patient Instructions  Medication Instructions:  Your provider recommends that you continue on your current medications as directed. Please refer to the Current Medication list given to you today.    Labwork: None  Testing/Procedures: Dr. Excell Seltzer recommends you have a NUCLEAR STRESS TEST.  Dr. Excell Seltzer recommends you wear a ZIO PATCH heart monitor for 2 weeks.   Follow-Up: Your provider wants you to follow-up in: 1 year with Dr. Excell Seltzer. You will receive a reminder letter in the mail two months in advance. If you don't receive a letter, please call our office to schedule the follow-up appointment.      Signed, Tonny Bollman, MD  09/28/2018 10:43 AM    [facility]

## 2018-09-28 NOTE — Patient Instructions (Signed)
Medication Instructions:  Your provider recommends that you continue on your current medications as directed. Please refer to the Current Medication list given to you today.    Labwork: None  Testing/Procedures: Dr. Excell Seltzer recommends you have a NUCLEAR STRESS TEST.  Dr. Excell Seltzer recommends you wear a ZIO PATCH heart monitor for 2 weeks.   Follow-Up: Your provider wants you to follow-up in: 1 year with Dr. Excell Seltzer. You will receive a reminder letter in the mail two months in advance. If you don't receive a letter, please call our office to schedule the follow-up appointment.

## 2018-10-06 ENCOUNTER — Telehealth (HOSPITAL_COMMUNITY): Payer: Self-pay | Admitting: *Deleted

## 2018-10-06 NOTE — Telephone Encounter (Signed)
Patient given detailed instructions per Myocardial Perfusion Study Information Sheet for the test on 10/11/18. Patient notified to arrive 15 minutes early and that it is imperative to arrive on time for appointment to keep from having the test rescheduled.  If you need to cancel or reschedule your appointment, please call the office within 24 hours of your appointment. . Patient verbalized understanding. [name]   

## 2018-10-11 ENCOUNTER — Ambulatory Visit (INDEPENDENT_AMBULATORY_CARE_PROVIDER_SITE_OTHER): Payer: Medicare Other

## 2018-10-11 ENCOUNTER — Ambulatory Visit (HOSPITAL_COMMUNITY): Payer: Medicare Other | Attending: Cardiology

## 2018-10-11 DIAGNOSIS — R0602 Shortness of breath: Secondary | ICD-10-CM

## 2018-10-11 DIAGNOSIS — R002 Palpitations: Secondary | ICD-10-CM | POA: Diagnosis not present

## 2018-10-11 DIAGNOSIS — I25119 Atherosclerotic heart disease of native coronary artery with unspecified angina pectoris: Secondary | ICD-10-CM | POA: Diagnosis present

## 2018-10-11 LAB — MYOCARDIAL PERFUSION IMAGING
Estimated workload: 5 METS
Exercise duration (min): 4 min
Exercise duration (sec): 31 s
LV dias vol: 84 mL (ref 62–150)
LV sys vol: 31 mL
MPHR: 148 {beats}/min
Peak HR: 141 {beats}/min
Percent HR: 95 %
Rest HR: 83 {beats}/min
SDS: 1
SRS: 0
SSS: 1
TID: 1.1

## 2018-10-11 MED ORDER — TECHNETIUM TC 99M TETROFOSMIN IV KIT
10.7000 | PACK | Freq: Once | INTRAVENOUS | Status: AC | PRN
  Administered 2018-10-11: 10.7 via INTRAVENOUS
  Filled 2018-10-11: qty 11

## 2018-10-11 MED ORDER — TECHNETIUM TC 99M TETROFOSMIN IV KIT
32.8000 | PACK | Freq: Once | INTRAVENOUS | Status: AC | PRN
  Administered 2018-10-11: 32.8 via INTRAVENOUS
  Filled 2018-10-11: qty 33

## 2018-10-17 ENCOUNTER — Other Ambulatory Visit: Payer: Self-pay | Admitting: Cardiovascular Disease

## 2018-11-07 ENCOUNTER — Telehealth: Payer: Self-pay | Admitting: Cardiovascular Disease

## 2018-11-07 NOTE — Telephone Encounter (Signed)
Pt called and said Orpha Bur, Dr. Earmon Phoenix nurse called him today around 1pm. He said it was about some lab results. He called to return her call, but she was unavailable. Please call him tomorrow.

## 2018-11-08 NOTE — Telephone Encounter (Signed)
Spoke with patient regarding monitor results, pt agrees to keep an eye out and not start any meds at this time. Pt verbalized understanding.

## 2018-12-19 ENCOUNTER — Ambulatory Visit: Payer: Medicare Other | Admitting: Cardiovascular Disease

## 2019-10-13 ENCOUNTER — Encounter: Payer: Self-pay | Admitting: Cardiovascular Disease

## 2019-10-13 ENCOUNTER — Other Ambulatory Visit: Payer: Self-pay

## 2019-10-13 ENCOUNTER — Ambulatory Visit: Payer: Medicare PPO | Admitting: Cardiovascular Disease

## 2019-10-13 VITALS — BP 126/54 | HR 87 | Ht 72.0 in | Wt 163.6 lb

## 2019-10-13 DIAGNOSIS — I2510 Atherosclerotic heart disease of native coronary artery without angina pectoris: Secondary | ICD-10-CM | POA: Diagnosis not present

## 2019-10-13 DIAGNOSIS — E782 Mixed hyperlipidemia: Secondary | ICD-10-CM

## 2019-10-13 NOTE — Patient Instructions (Signed)

## 2019-10-13 NOTE — Progress Notes (Signed)
Cardiology Office Note:    Date:  10/13/2019   ID:  [name], DOB 13-Dec-1945, MRN [id number]  PCP:  Joycelyn Rua, MD  Cardiologist:  Tonny Bollman, MD  Electrophysiologist:  None   Referring MD: Joycelyn Rua, MD   Chief Complaint  Patient presents with  . Coronary Artery Disease    History of Present Illness:    [name] is a 74 y.o. male with a hx of coronary artery disease, presenting for follow-up evaluation.  The patient underwent remote stenting of the RCA and then later presented with an acute inferior MI in 2009 caused by very late stent thrombosis.  He underwent repeat stenting in 2014 when he presented with worsening exertional dyspnea.    When he was seen 1 year ago he complained of exertional dyspnea and heart palpitations.  An event monitor showed an average heart rate of 87 bpm with occasional PVCs, short ventricular runs up to 8 beats on rare occasion, and no evidence of atrial fibrillation or flutter. The Myoview scan showed no significant ischemia and normal LVEF. His old inferior infarct was evident. Today, he denies symptoms of palpitations, chest pain, shortness of breath, orthopnea, PND, lower extremity edema, dizziness, or syncope.     Past Medical History:  Diagnosis Date  . CAD in native artery    a. remote stenting to the RCA. b. inferior MI 2009 secondary to stent thrombosis s/p angioplasty to RCA. c. DES to PDA 05/2013.  Marland Kitchen Chronic neck pain   . Diabetes mellitus (HCC)   . Hyperlipidemia   . Ischemic cardiomyopathy    a. EF 45% in 2009 at time of MI. b. Subsequent EFs normal.  . Myocardial infarct (HCC) 09-11-06, 2009  . Orthostatic hypotension     Past Surgical History:  Procedure Laterality Date  . CARDIAC CATHETERIZATION  10-04-08  . CERVICAL SPINE SURGERY    . CORONARY ANGIOPLASTY WITH STENT PLACEMENT  05/2013  . LEFT HEART CATHETERIZATION WITH CORONARY ANGIOGRAM N/A 05/11/2013   Procedure: LEFT HEART CATHETERIZATION WITH CORONARY  ANGIOGRAM;  Surgeon: Micheline Chapman, MD;  Location: [facility] CATH LAB;  Service: Cardiovascular;  Laterality: N/A;  . LUMBAR LAMINECTOMY/DECOMPRESSION MICRODISCECTOMY Left 04/11/2015   Procedure: LUMBAR LAMINECTOMY/DECOMPRESSION MICRODISCECTOMY;  Surgeon: Estill Bamberg, MD;  Location: [facility] OR;  Service: Orthopedics;  Laterality: Left;  Left sided lumbar 4-5 microdisectomy    Current Medications: Current Meds  Medication Sig  . acetaminophen (TYLENOL) 650 MG CR tablet Take 650 mg by mouth every 8 (eight) hours as needed for pain.  Marland Kitchen aspirin 81 MG tablet Take 1 tablet (81 mg total) by mouth daily.  . clopidogrel (PLAVIX) 75 MG tablet Take 1 tablet (75 mg total) by mouth daily.  . metFORMIN (GLUCOPHAGE) 500 MG tablet Take by mouth 2 (two) times daily with a meal.  . niacin (NIASPAN) 750 MG CR tablet Take 1 tablet (750 mg total) by mouth at bedtime.  . nitroGLYCERIN (NITROSTAT) 0.4 MG SL tablet Place 1 tablet (0.4 mg total) under the tongue every 5 (five) minutes as needed for chest pain.  . Omega-3 Fatty Acids (OMEGA-3 FISH OIL PO) Take 2.4 g by mouth daily.   . simvastatin (ZOCOR) 20 MG tablet Take 20 mg by mouth daily.     Allergies:   Beta adrenergic blockers   Social History   Socioeconomic History  . Marital status: Married    Spouse name: Not on file  . Number of children: 1  . Years of education: Not on  file  . Highest education level: Not on file  Occupational History  . Occupation: retired Set designer: RETIRED  Tobacco Use  . Smoking status: Former Research scientist (life sciences)  . Smokeless tobacco: Never Used  Substance and Sexual Activity  . Alcohol use: No  . Drug use: No  . Sexual activity: Not Currently  Other Topics Concern  . Not on file  Social History Narrative  . Not on file   Social Determinants of Health   Financial Resource Strain:   . Difficulty of Paying Living Expenses: Not on file  Food Insecurity:   . Worried About Charity fundraiser in the Last Year: Not on  file  . Ran Out of Food in the Last Year: Not on file  Transportation Needs:   . Lack of Transportation (Medical): Not on file  . Lack of Transportation (Non-Medical): Not on file  Physical Activity:   . Days of Exercise per Week: Not on file  . Minutes of Exercise per Session: Not on file  Stress:   . Feeling of Stress : Not on file  Social Connections:   . Frequency of Communication with Friends and Family: Not on file  . Frequency of Social Gatherings with Friends and Family: Not on file  . Attends Religious Services: Not on file  . Active Member of Clubs or Organizations: Not on file  . Attends Archivist Meetings: Not on file  . Marital Status: Not on file     Family History: The patient's family history includes Coronary artery disease in an other family member; Heart attack in his brother; Heart attack (age of onset: 49) in his father; Heart failure in his mother. There is no history of Hypertension or Stroke.  ROS:   Please see the history of present illness.    All other systems reviewed and are negative.  EKGs/Labs/Other Studies Reviewed:    The following studies were reviewed today: Event monitor 10/11/2018: Study Highlights  1.  The basic rhythm is normal sinus with an average heart rate of 87 bpm 2. There are occasional PVCs and rare, short runs of NSVT up to 8 beats 3. There are occasional paroxysms of SVT, short runs 4. No evidence of atrial fibrillation or flutter 5. No pathologic pauses or bradycardic events 6. No sustained arrhythmias   Myoview stress test 10/11/2018: Study Highlights    Nuclear stress EF: 63%.  The left ventricular ejection fraction is normal (55-65%).  Blood pressure demonstrated a normal response to exercise.  There was no ST segment deviation noted during stress.  Defect 1: There is a medium defect of moderate severity present in the basal inferior and mid inferior location.  This is a low risk study.   Abnormal,  low risk stress nuclear study with diaphragmatic attenuation versus prior inferior infarct.  No ischemia.  Gated ejection fraction 63% with normal wall motion.      EKG:  EKG is ordered today.  The ekg ordered today demonstrates NSR 87 bpm, age indeterminate inferior infarct  Recent Labs: No results found for requested labs within last 8760 hours.  Recent Lipid Panel    Component Value Date/Time   CHOL 127 09/25/2014 0830   TRIG 88.0 09/25/2014 0830   HDL 40.90 09/25/2014 0830   CHOLHDL 3 09/25/2014 0830   VLDL 17.6 09/25/2014 0830   LDLCALC 69 09/25/2014 0830    Physical Exam:    VS:  BP (!) 126/54   Pulse 87  Ht 6' (1.829 m)   Wt 163 lb 9.6 oz (74.2 kg)   SpO2 98%   BMI 22.19 kg/m     Wt Readings from Last 3 Encounters:  10/13/19 163 lb 9.6 oz (74.2 kg)  10/11/18 156 lb (70.8 kg)  09/28/18 156 lb 9.6 oz (71 kg)     GEN:  Well nourished, well developed in no acute distress HEENT: Normal NECK: No JVD; No carotid bruits LYMPHATICS: No lymphadenopathy CARDIAC: RRR, no murmurs, rubs, gallops RESPIRATORY:  Clear to auscultation without rales, wheezing or rhonchi  ABDOMEN: Soft, non-tender, non-distended MUSCULOSKELETAL:  No edema; No deformity  SKIN: Warm and dry NEUROLOGIC:  Alert and oriented x 3 PSYCHIATRIC:  Normal affect   ASSESSMENT:    1. Mixed hyperlipidemia   2. Coronary artery disease involving native coronary artery of native heart without angina pectoris    PLAN:    In order of problems listed above:  1. The patient is treated with a combination of simvastatin and Niaspan.  Lipids have been managed by his primary physician.  He is due for his annual physical next month. 2. The patient is doing well.  I reviewed his nuclear scan from last year which showed no significant ischemia.  He has a remote history of stent thrombosis and is treated with aspirin and clopidogrel long-term.  He remains on a statin drug.   Medication Adjustments/Labs and Tests  Ordered: Current medicines are reviewed at length with the patient today.  Concerns regarding medicines are outlined above.  Orders Placed This Encounter  Procedures  . EKG 12-Lead   No orders of the defined types were placed in this encounter.   Patient Instructions  Medication Instructions:  Your provider recommends that you continue on your current medications as directed. Please refer to the Current Medication list given to you today.   *If you need a refill on your cardiac medications before your next appointment, please call your pharmacy*   Follow-Up: At [facility], you and your health needs are our priority.  As part of our continuing mission to provide you with exceptional heart care, we have created designated Provider Care Teams.  These Care Teams include your primary Cardiologist (physician) and Advanced Practice Providers (APPs -  Physician Assistants and Nurse Practitioners) who all work together to provide you with the care you need, when you need it. Your next appointment:   12 month(s) The format for your next appointment:   In Person Provider:   You may see Tonny Bollman, MD or one of the following Advanced Practice Providers on your designated Care Team:    Tereso Newcomer, PA-C  Vin Stockton, PA-C  [place] Bon, [place]    Signed, Tonny Bollman, MD  10/13/2019 2:05 PM    [facility]

## 2019-11-01 ENCOUNTER — Other Ambulatory Visit: Payer: Self-pay | Admitting: Cardiovascular Disease

## 2019-11-10 ENCOUNTER — Ambulatory Visit: Payer: Medicare Other | Admitting: Cardiovascular Disease

## 2019-12-11 DIAGNOSIS — H31001 Unspecified chorioretinal scars, right eye: Secondary | ICD-10-CM | POA: Diagnosis not present

## 2019-12-11 DIAGNOSIS — E119 Type 2 diabetes mellitus without complications: Secondary | ICD-10-CM | POA: Diagnosis not present

## 2019-12-20 DIAGNOSIS — E7800 Pure hypercholesterolemia, unspecified: Secondary | ICD-10-CM | POA: Diagnosis not present

## 2020-04-19 DIAGNOSIS — I700 Atherosclerosis of aorta: Secondary | ICD-10-CM | POA: Diagnosis not present

## 2020-04-19 DIAGNOSIS — I2510 Atherosclerotic heart disease of native coronary artery without angina pectoris: Secondary | ICD-10-CM | POA: Diagnosis not present

## 2020-04-19 DIAGNOSIS — E1159 Type 2 diabetes mellitus with other circulatory complications: Secondary | ICD-10-CM | POA: Diagnosis not present

## 2020-04-19 DIAGNOSIS — Z0000 Encounter for general adult medical examination without abnormal findings: Secondary | ICD-10-CM | POA: Diagnosis not present

## 2020-04-19 DIAGNOSIS — Z794 Long term (current) use of insulin: Secondary | ICD-10-CM | POA: Diagnosis not present

## 2020-04-19 DIAGNOSIS — E7800 Pure hypercholesterolemia, unspecified: Secondary | ICD-10-CM | POA: Diagnosis not present

## 2020-09-12 DIAGNOSIS — E119 Type 2 diabetes mellitus without complications: Secondary | ICD-10-CM | POA: Diagnosis not present

## 2020-09-12 DIAGNOSIS — H2513 Age-related nuclear cataract, bilateral: Secondary | ICD-10-CM | POA: Diagnosis not present

## 2020-09-12 DIAGNOSIS — H5203 Hypermetropia, bilateral: Secondary | ICD-10-CM | POA: Diagnosis not present

## 2020-09-12 DIAGNOSIS — H35411 Lattice degeneration of retina, right eye: Secondary | ICD-10-CM | POA: Diagnosis not present

## 2020-09-12 DIAGNOSIS — H524 Presbyopia: Secondary | ICD-10-CM | POA: Diagnosis not present

## 2020-09-12 DIAGNOSIS — H52203 Unspecified astigmatism, bilateral: Secondary | ICD-10-CM | POA: Diagnosis not present

## 2020-10-08 NOTE — Progress Notes (Signed)
Cardiology Office Note:    Date:  10/09/2020   ID:  SEBRON MCMAHILL, DOB 11/04/1945, MRN [id number]  PCP:  Joycelyn Rua, MD  [facility] Cardiologist:  Tonny Bollman, MD  [facility] Electrophysiologist:  None   Referring MD: Joycelyn Rua, MD   Chief Complaint:  Follow-up (CAD)    Patient Profile:    [name] is a 75 y.o. male with:   Coronary artery disease   S/p remote stenting to RCA  S/p inf MI in 2009 2/2 very late stent thrombosis s/p POBA to RCA  S/p DES to PDA in 2014 2/2 ISR   Diabetes mellitus   Hyperlipidemia   Ischemic CM >> EF originally 45 >> improved to normal.   Orthostatic hypotension   Prior CV studies: Event monitor 10/11/2018 1.  The basic rhythm is normal sinus with an average heart rate of 87 bpm 2. There are occasional PVCs and rare, short runs of NSVT up to 8 beats 3. There are occasional paroxysms of SVT, short runs 4. No evidence of atrial fibrillation or flutter 5. No pathologic pauses or bradycardic events 6. No sustained arrhythmias  Myoview 10/11/2018 Abnormal, low risk stress nuclear study with diaphragmatic attenuation versus prior inferior infarct.  No ischemia.  Gated ejection fraction 63% with normal wall motion.  Cardiac catheterization 05/11/13 LM 20-30 LAD prox 20 LCx normal RCA patent stents; PDA stent 95 ISR PCI: 2.5 x 16 mm Promus DES to pPDA   History of Present Illness:    [name] was last seen by Dr. Excell Seltzer in 2/21.  He returns for f/u.  He is here alone.  He has had some numbness in his feet.  He is followed by primary care for his diabetes.  He notes that testing his come back negative for neuropathy.  He does describe an episode of stepping wrong and getting significant bruising in his foot several weeks ago.  This seems to be resolving.  I have asked him to follow-up with primary care for this.  He does note an occasional pain in his left arm.  He also notes a cramp or squeezing sensation in his left  chest.  This does not seem to be related to exertion.  He does note shortness of breath with activity.  He has had this for several months without significant change.  He brings in a chart of his activity.  In the warmer months, he is usually very active and walks over 100 miles per month.  In the winter months, he does not walk as much.  He has not had orthopnea, PND or significant leg edema.      Past Medical History:  Diagnosis Date  . CAD in native artery    a. remote stenting to the RCA. b. inferior MI 2009 secondary to stent thrombosis s/p angioplasty to RCA. c. DES to PDA 05/2013.  Marland Kitchen Chronic neck pain   . Diabetes mellitus (HCC)   . Hyperlipidemia   . Ischemic cardiomyopathy    a. EF 45% in 2009 at time of MI. b. Subsequent EFs normal.  . Myocardial infarct (HCC) 09-11-06, 2009  . Orthostatic hypotension     Current Medications: Current Meds  Medication Sig  . acetaminophen (TYLENOL) 650 MG CR tablet Take 650 mg by mouth every 8 (eight) hours as needed for pain.  Marland Kitchen aspirin 81 MG tablet Take 1 tablet (81 mg total) by mouth daily.  . clopidogrel (PLAVIX) 75 MG tablet Take 1 tablet by mouth  once daily  . metFORMIN (GLUCOPHAGE) 500 MG tablet Take by mouth 2 (two) times daily with a meal.  . niacin (NIASPAN) 750 MG CR tablet Take 1 tablet (750 mg total) by mouth at bedtime.  . nitroGLYCERIN (NITROSTAT) 0.4 MG SL tablet Place 1 tablet (0.4 mg total) under the tongue every 5 (five) minutes as needed for chest pain.  . Omega-3 Fatty Acids (OMEGA-3 FISH OIL PO) Take 2.4 g by mouth daily.  . simvastatin (ZOCOR) 40 MG tablet Take 40 mg by mouth daily at 6 PM.     Allergies:   Beta adrenergic blockers   Social History   Tobacco Use  . Smoking status: Former Games developer  . Smokeless tobacco: Never Used  Vaping Use  . Vaping Use: Never used  Substance Use Topics  . Alcohol use: No  . Drug use: No     Family Hx: The patient's family history includes Coronary artery disease in an other  family member; Heart attack in his brother; Heart attack (age of onset: 24) in his father; Heart failure in his mother. There is no history of Hypertension or Stroke.  Review of Systems  Gastrointestinal: Negative for hematochezia and melena.  Genitourinary: Negative for hematuria.     EKGs/Labs/Other Test Reviewed:    EKG:  EKG is   ordered today.  The ekg ordered today demonstrates normal sinus rhythm, heart rate 69, normal axis, nonspecific ST-T wave changes, QTC 435   Recent Labs: No results found for requested labs within last 8760 hours.   Recent Lipid Panel Lab Results  Component Value Date/Time   CHOL 127 09/25/2014 08:30 AM   TRIG 88.0 09/25/2014 08:30 AM   HDL 40.90 09/25/2014 08:30 AM   CHOLHDL 3 09/25/2014 08:30 AM   LDLCALC 69 09/25/2014 08:30 AM      Risk Assessment/Calculations:      Physical Exam:    VS:  BP 122/62   Pulse 69   Ht 6' (1.829 m)   Wt 163 lb 6.4 oz (74.1 kg)   SpO2 98%   BMI 22.16 kg/m     Wt Readings from Last 3 Encounters:  10/09/20 163 lb 6.4 oz (74.1 kg)  10/13/19 163 lb 9.6 oz (74.2 kg)  10/11/18 156 lb (70.8 kg)     Constitutional:      Appearance: Healthy appearance. Not in distress.  Neck:     Vascular: No carotid bruit or JVR. JVD normal.  Pulmonary:     Effort: Pulmonary effort is normal.     Breath sounds: No wheezing. No rales.  Cardiovascular:     Normal rate. Regular rhythm. Normal S1. Normal S2.     Murmurs: There is no murmur.  Edema:    Peripheral edema absent.  Abdominal:     Palpations: Abdomen is soft. There is no hepatomegaly.  Skin:    General: Skin is warm and dry.  Neurological:     General: No focal deficit present.     Mental Status: Alert and oriented to person, place and time.     Cranial Nerves: Cranial nerves are intact.      ASSESSMENT & PLAN:    1. Coronary artery disease involving native coronary artery of native heart without angina pectoris 2. Other chest pain 3. Shortness of  breath on exertion History of prior PCI to the RCA with subsequent inferior MI in 2009 secondary to very late stent thrombosis treated with angioplasty.  His last PCI was in 2014 and he underwent placement  of a DES to the PDA secondary to in-stent restenosis.  His last stress test was in 10/2018.  This was low risk.  He has had symptoms recently of left chest cramping/pressure.  He is also has some left arm symptoms.  He has been less active during the winter months.  He does note report some shortness of breath but I suspect that this is related to deconditioning.  His electrocardiogram is unchanged.  In light of his chest pain and given his history, I have suggested that we go ahead and proceed with stress testing to rule out ischemia.  -Continue aspirin, clopidogrel, simvastatin  -Arrange Lexiscan Myoview  -Follow-up sooner if symptoms persist or worsen  4. Mixed hyperlipidemia Managed by primary care.  LDL in 4/21 was 73.  His simvastatin had been adjusted prior to this.  If his LDL continues to remain >70, consider switching to rosuvastatin 40 mg versus adding ezetimibe.  5. Type 2 diabetes mellitus with complication (HCC) Managed by primary care.  He recently had an injury to his foot without trauma.  He also notes a lot of symptoms that sound suspicious for neuropathy.  I have asked him to arrange follow-up with primary care. Shared Decision Making/Informed Consent The risks [chest pain, shortness of breath, cardiac arrhythmias, dizziness, blood pressure fluctuations, myocardial infarction, stroke/transient ischemic attack, nausea, vomiting, allergic reaction, radiation exposure, metallic taste sensation and life-threatening complications (estimated to be 1 in 10,000)], benefits (risk stratification, diagnosing coronary artery disease, treatment guidance) and alternatives of a nuclear stress test were discussed in detail with [name] and he agrees to proceed.     Dispo:  Return in about 1  year (around 10/09/2021) for Routine Follow Up, w/ Dr. Excell Seltzer.   Medication Adjustments/Labs and Tests Ordered: Current medicines are reviewed at length with the patient today.  Concerns regarding medicines are outlined above.  Tests Ordered: Orders Placed This Encounter  Procedures  . Cardiac Stress Test: Informed Consent Details: Physician/Practitioner Attestation; Transcribe to consent form and obtain patient signature  . Myocardial Perfusion Imaging  . EKG 12-Lead   Medication Changes: No orders of the defined types were placed in this encounter.   Signed, Tereso Newcomer, PA-C  10/09/2020 1:51 PM    [facility] [address] [place], [place], [place]  [address] Phone: [phone]; Fax: [phone]

## 2020-10-09 ENCOUNTER — Encounter: Payer: Self-pay | Admitting: Physician Assistant

## 2020-10-09 ENCOUNTER — Other Ambulatory Visit: Payer: Self-pay

## 2020-10-09 ENCOUNTER — Ambulatory Visit: Payer: Medicare PPO | Admitting: Physician Assistant

## 2020-10-09 VITALS — BP 122/62 | HR 69 | Ht 72.0 in | Wt 163.4 lb

## 2020-10-09 DIAGNOSIS — E118 Type 2 diabetes mellitus with unspecified complications: Secondary | ICD-10-CM

## 2020-10-09 DIAGNOSIS — E782 Mixed hyperlipidemia: Secondary | ICD-10-CM | POA: Diagnosis not present

## 2020-10-09 DIAGNOSIS — R002 Palpitations: Secondary | ICD-10-CM

## 2020-10-09 DIAGNOSIS — R0602 Shortness of breath: Secondary | ICD-10-CM

## 2020-10-09 DIAGNOSIS — R0789 Other chest pain: Secondary | ICD-10-CM

## 2020-10-09 DIAGNOSIS — I2510 Atherosclerotic heart disease of native coronary artery without angina pectoris: Secondary | ICD-10-CM | POA: Diagnosis not present

## 2020-10-09 NOTE — Patient Instructions (Signed)
Medication Instructions:  Your physician recommends that you continue on your current medications as directed. Please refer to the Current Medication list given to you today.  *If you need a refill on your cardiac medications before your next appointment, please call your pharmacy*   Lab Work: None ordered   If you have labs (blood work) drawn today and your tests are completely normal, you will receive your results only by: Marland Kitchen MyChart Message (if you have MyChart) OR . A paper copy in the mail If you have any lab test that is abnormal or we need to change your treatment, we will call you to review the results.   Testing/Procedures: Your physician has requested that you have a lexiscan myoview. For further information please visit [email or web address] Please follow instruction sheet, as given.   Follow-Up: At [facility], you and your health needs are our priority.  As part of our continuing mission to provide you with exceptional heart care, we have created designated Provider Care Teams.  These Care Teams include your primary Cardiologist (physician) and Advanced Practice Providers (APPs -  Physician Assistants and Nurse Practitioners) who all work together to provide you with the care you need, when you need it.  We recommend signing up for the patient portal called "MyChart".  Sign up information is provided on this After Visit Summary.  MyChart is used to connect with patients for Virtual Visits (Telemedicine).  Patients are able to view lab/test results, encounter notes, upcoming appointments, etc.  Non-urgent messages can be sent to your provider as well.   To learn more about what you can do with MyChart, go to [email or web address].    Your next appointment:   12 month(s)  The format for your next appointment:   In Person  Provider:   You may see Tonny Bollman, MD or one of the following Advanced Practice Providers on your designated Care Team:    Tereso Newcomer,  PA-C  Chelsea Aus, [place]    Other Instructions None

## 2020-10-10 ENCOUNTER — Encounter (HOSPITAL_COMMUNITY): Payer: Self-pay | Admitting: *Deleted

## 2020-10-10 ENCOUNTER — Telehealth (HOSPITAL_COMMUNITY): Payer: Self-pay | Admitting: *Deleted

## 2020-10-10 NOTE — Telephone Encounter (Signed)
Left message on voicemail in reference to upcoming appointment scheduled for 10/16/20. Phone number given for a call back so details instructions can be given.  Sherene Plancarte Jacqueline   

## 2020-10-16 ENCOUNTER — Ambulatory Visit (HOSPITAL_COMMUNITY): Payer: Medicare PPO | Attending: Cardiology

## 2020-10-16 ENCOUNTER — Other Ambulatory Visit: Payer: Self-pay

## 2020-10-16 DIAGNOSIS — R0789 Other chest pain: Secondary | ICD-10-CM | POA: Insufficient documentation

## 2020-10-16 LAB — MYOCARDIAL PERFUSION IMAGING
LV dias vol: 88 mL (ref 62–150)
LV sys vol: 32 mL
Peak HR: 88 {beats}/min
Rest HR: 65 {beats}/min
SDS: 1
SRS: 0
SSS: 1
TID: 1.02

## 2020-10-16 MED ORDER — TECHNETIUM TC 99M TETROFOSMIN IV KIT
10.6000 | PACK | Freq: Once | INTRAVENOUS | Status: AC | PRN
  Administered 2020-10-16: 10.6 via INTRAVENOUS
  Filled 2020-10-16: qty 11

## 2020-10-16 MED ORDER — REGADENOSON 0.4 MG/5ML IV SOLN
0.4000 mg | Freq: Once | INTRAVENOUS | Status: AC
  Administered 2020-10-16: 0.4 mg via INTRAVENOUS

## 2020-10-16 MED ORDER — TECHNETIUM TC 99M TETROFOSMIN IV KIT
30.7000 | PACK | Freq: Once | INTRAVENOUS | Status: AC | PRN
  Administered 2020-10-16: 30.7 via INTRAVENOUS
  Filled 2020-10-16: qty 31

## 2020-10-17 ENCOUNTER — Encounter: Payer: Self-pay | Admitting: Physician Assistant

## 2020-11-04 ENCOUNTER — Other Ambulatory Visit: Payer: Self-pay | Admitting: Cardiovascular Disease

## 2021-02-06 ENCOUNTER — Other Ambulatory Visit: Payer: Self-pay | Admitting: Cardiovascular Disease

## 2021-04-08 DIAGNOSIS — Z7984 Long term (current) use of oral hypoglycemic drugs: Secondary | ICD-10-CM | POA: Diagnosis not present

## 2021-04-08 DIAGNOSIS — I2510 Atherosclerotic heart disease of native coronary artery without angina pectoris: Secondary | ICD-10-CM | POA: Diagnosis not present

## 2021-04-08 DIAGNOSIS — Z0000 Encounter for general adult medical examination without abnormal findings: Secondary | ICD-10-CM | POA: Diagnosis not present

## 2021-04-08 DIAGNOSIS — E1159 Type 2 diabetes mellitus with other circulatory complications: Secondary | ICD-10-CM | POA: Diagnosis not present

## 2021-04-08 DIAGNOSIS — D692 Other nonthrombocytopenic purpura: Secondary | ICD-10-CM | POA: Diagnosis not present

## 2021-04-08 DIAGNOSIS — I700 Atherosclerosis of aorta: Secondary | ICD-10-CM | POA: Diagnosis not present

## 2021-04-08 DIAGNOSIS — E7800 Pure hypercholesterolemia, unspecified: Secondary | ICD-10-CM | POA: Diagnosis not present

## 2021-04-30 DIAGNOSIS — E875 Hyperkalemia: Secondary | ICD-10-CM | POA: Diagnosis not present

## 2021-10-09 DIAGNOSIS — I2510 Atherosclerotic heart disease of native coronary artery without angina pectoris: Secondary | ICD-10-CM | POA: Diagnosis not present

## 2021-10-09 DIAGNOSIS — Z7984 Long term (current) use of oral hypoglycemic drugs: Secondary | ICD-10-CM | POA: Diagnosis not present

## 2021-10-09 DIAGNOSIS — E1159 Type 2 diabetes mellitus with other circulatory complications: Secondary | ICD-10-CM | POA: Diagnosis not present

## 2021-10-09 DIAGNOSIS — I739 Peripheral vascular disease, unspecified: Secondary | ICD-10-CM | POA: Diagnosis not present

## 2021-10-09 DIAGNOSIS — E7800 Pure hypercholesterolemia, unspecified: Secondary | ICD-10-CM | POA: Diagnosis not present

## 2021-10-09 DIAGNOSIS — I700 Atherosclerosis of aorta: Secondary | ICD-10-CM | POA: Diagnosis not present

## 2021-11-04 DIAGNOSIS — H43813 Vitreous degeneration, bilateral: Secondary | ICD-10-CM | POA: Diagnosis not present

## 2021-11-04 DIAGNOSIS — H2513 Age-related nuclear cataract, bilateral: Secondary | ICD-10-CM | POA: Diagnosis not present

## 2021-11-04 DIAGNOSIS — H35033 Hypertensive retinopathy, bilateral: Secondary | ICD-10-CM | POA: Diagnosis not present

## 2021-11-04 DIAGNOSIS — H524 Presbyopia: Secondary | ICD-10-CM | POA: Diagnosis not present

## 2021-11-04 DIAGNOSIS — E119 Type 2 diabetes mellitus without complications: Secondary | ICD-10-CM | POA: Diagnosis not present

## 2021-11-04 DIAGNOSIS — H52223 Regular astigmatism, bilateral: Secondary | ICD-10-CM | POA: Diagnosis not present

## 2021-11-04 DIAGNOSIS — H35411 Lattice degeneration of retina, right eye: Secondary | ICD-10-CM | POA: Diagnosis not present

## 2021-11-07 ENCOUNTER — Other Ambulatory Visit: Payer: Self-pay | Admitting: Cardiovascular Disease

## 2021-11-24 ENCOUNTER — Other Ambulatory Visit: Payer: Self-pay

## 2021-11-24 ENCOUNTER — Ambulatory Visit: Payer: Medicare PPO | Admitting: Physician Assistant

## 2021-11-24 ENCOUNTER — Encounter: Payer: Self-pay | Admitting: Physician Assistant

## 2021-11-24 VITALS — BP 102/60 | HR 63 | Ht 72.0 in | Wt 154.6 lb

## 2021-11-24 DIAGNOSIS — I255 Ischemic cardiomyopathy: Secondary | ICD-10-CM

## 2021-11-24 DIAGNOSIS — I2510 Atherosclerotic heart disease of native coronary artery without angina pectoris: Secondary | ICD-10-CM

## 2021-11-24 DIAGNOSIS — E785 Hyperlipidemia, unspecified: Secondary | ICD-10-CM | POA: Diagnosis not present

## 2021-11-24 MED ORDER — CLOPIDOGREL BISULFATE 75 MG PO TABS: 75.0000 mg | ORAL_TABLET | Freq: Every day | ORAL | 3 refills | Status: DC

## 2021-11-24 NOTE — Assessment & Plan Note (Signed)
LDL optimal in Aug 22.  Continue Simvastatin 40 mg once daily.  Plan f/u fasting CMET, Lipids in 6 mos.  ?

## 2021-11-24 NOTE — Patient Instructions (Addendum)
Medication Instructions:  ?Your physician recommends that you continue on your current medications as directed. Please refer to the Current Medication list given to you today.  ?*If you need a refill on your cardiac medications before your next appointment, please call your pharmacy* ? ? ?Lab Work: ?CMET and fasting lipids in May 27, 2022 You may come in anytime this day between 7:15 AM and 4:30 PM ?If you have labs (blood work) drawn today and your tests are completely normal, you will receive your results only by: ?MyChart Message (if you have MyChart) OR ?A paper copy in the mail ?If you have any lab test that is abnormal or we need to change your treatment, we will call you to review the results. ? ? ?Follow-Up: ?At [facility], you and your health needs are our priority.  As part of our continuing mission to provide you with exceptional heart care, we have created designated Provider Care Teams.  These Care Teams include your primary Cardiologist (physician) and Advanced Practice Providers (APPs -  Physician Assistants and Nurse Practitioners) who all work together to provide you with the care you need, when you need it. ? ? ?Your next appointment:   ?1 year(s) ? ?The format for your next appointment:   ?In Person ? ?Provider:   ?Tonny Bollman, MD  or Tereso Newcomer, PA-C     { ?

## 2021-11-24 NOTE — Assessment & Plan Note (Signed)
EF was previously 45 and improved to normal. EF on Myoview in Feb 22 was normal.  BP runs on the low side. Therefore he is not on beta-blocker, ACE/ARB.  He has no signs or symptoms of clinic HF.  ?

## 2021-11-24 NOTE — Progress Notes (Signed)
?Cardiology Office Note:   ? ?Date:  11/24/2021  ? ?ID:  [name], DOB 04/08/1946, MRN [id number] ? ?PCP:  Joycelyn RuaMeyers, Stephen, MD  ?[facility] Providers ?Cardiologist:  Tonny BollmanMichael Cooper, MD ?Cardiology APP:  Beatrice LecherWeaver, [place] T, PA-C     ?Referring MD: Joycelyn RuaMeyers, Stephen, MD  ? ?Chief Complaint:  F/u for CAD ?  ? ?Patient Profile: ?Coronary artery disease  ?S/p remote stenting to RCA ?S/p inf MI in 2009 2/2 very late stent thrombosis s/p POBA to RCA ?S/p DES to PDA in 2014 2/2 ISR  ?Myoview Feb 2020: low risk  ?Myoview Feb 22: low risk EF 2464 ?Diabetes mellitus  ?Hyperlipidemia  ?Ischemic CM >> EF originally 45 >> improved to normal.  ?Orthostatic hypotension  ?  ?Prior CV studies: ?Myoview 10/16/2020 ?EF 64, no ischemia, inferior infarct versus diaphragmatic attenuation; low risk ? ?Event monitor 10/11/2018 ?NSR, average HR 87 ?Occasional PVCs, rare short runs of NSVT ?Occasional paroxysms of SVT (short runs) ? ?Cardiac catheterization 05/11/13 ?LM 20-30 ?LAD prox 20 ?LCx normal ?RCA patent stents; PDA stent 95 ISR ?PCI: 2.5 x 16 mm Promus DES to pPDA  ? ? ?History of Present Illness:   ?Jack ModeJohn R Hast is a 76 y.o. male with the above problem list.  He was last seen in February 2022.  He returns for follow-up.  He is here alone.  He is doing well without shortness of breath, syncope, orthopnea.  He has some mild leg edema that resolves with elevation.  He notes a fleeting pain in his L chest.  This has been present for years without change.  He is walking several times a week without exertional chest pain.       ?   ?Past Medical History:  ?Diagnosis Date  ? CAD in native artery   ? a. remote stenting to the RCA. b. inferior MI 2009 secondary to stent thrombosis s/p angioplasty to RCA. c. DES to PDA 05/2013. // Myoview 2/22: EF 64, inf defect (infarct vs artifact), no ischemia, low risk   ? Chronic neck pain   ? Diabetes mellitus (HCC)   ? Hyperlipidemia   ? Ischemic cardiomyopathy   ? a. EF 45% in 2009 at time of MI. b.  Subsequent EFs normal.  ? Myocardial infarct [facility](HCC) 09-11-06, 2009  ? Orthostatic hypotension   ? ?Current Medications: ?Current Meds  ?Medication Sig  ? acetaminophen (TYLENOL) 650 MG CR tablet Take 650 mg by mouth every 8 (eight) hours as needed for pain.  ? aspirin 81 MG tablet Take 1 tablet (81 mg total) by mouth daily.  ? metFORMIN (GLUCOPHAGE) 500 MG tablet Take by mouth 2 (two) times daily with a meal.  ? niacin (NIASPAN) 750 MG CR tablet Take 1 tablet (750 mg total) by mouth at bedtime.  ? nitroGLYCERIN (NITROSTAT) 0.4 MG SL tablet Place 1 tablet (0.4 mg total) under the tongue every 5 (five) minutes as needed for chest pain.  ? Omega-3 Fatty Acids (OMEGA-3 FISH OIL PO) Take 2.4 g by mouth daily.  ? simvastatin (ZOCOR) 40 MG tablet Take 40 mg by mouth daily at 6 PM.  ? [DISCONTINUED] clopidogrel (PLAVIX) 75 MG tablet Take 1 tablet (75 mg total) by mouth daily. Please make overdue appt with Dr. Excell Seltzerooper before anymore refills. Thank you 1st attempt  ?  ?Allergies:   Beta adrenergic blockers  ? ?Social History  ? ?Tobacco Use  ? Smoking status: Former  ? Smokeless tobacco: Never  ?Vaping Use  ? Vaping Use:  Never used  ?Substance Use Topics  ? Alcohol use: No  ? Drug use: No  ?  ?Family Hx: ?The patient's family history includes Coronary artery disease in an other family member; Heart attack in his brother; Heart attack (age of onset: 90) in his father; Heart failure in his mother. There is no history of Hypertension or Stroke. ? ?Review of Systems  ?Cardiovascular:  Negative for claudication.   ? ?EKGs/Labs/Other Test Reviewed:   ? ?EKG:  EKG is  ordered today.  The ekg ordered today demonstrates normal sinus rhythm, HR 63, normal axis, no ST-TW changes, QTc 417 ms  ? ?Labs obtained through [facility] Tool - personally reviewed and interpreted: ?04/08/21: TC 135, HDL 51, LDL 69, Trigs 68, ALT 24, Cr 0.97, K+ 4.7 ? ? ?Risk Assessment/Calculations:   ?  ?    ?Physical Exam:   ? ?VS:  BP 102/60 (BP Location: Right Arm,  Patient Position: Sitting, Cuff Size: Normal)   Pulse 63   Ht 6' (1.829 m)   Wt 154 lb 9.6 oz (70.1 kg)   SpO2 97%   BMI 20.97 kg/m?    ? ?Wt Readings from Last 3 Encounters:  ?11/24/21 154 lb 9.6 oz (70.1 kg)  ?10/16/20 163 lb (73.9 kg)  ?10/09/20 163 lb 6.4 oz (74.1 kg)  ?  ?Constitutional:   ?   Appearance: Healthy appearance. Not in distress.  ?Neck:  ?   Vascular: No carotid bruit or JVR. JVD normal.  ?Pulmonary:  ?   Effort: Pulmonary effort is normal.  ?   Breath sounds: No wheezing. No rales.  ?Cardiovascular:  ?   Normal rate. Regular rhythm. Normal S1. Normal S2.   ?   Murmurs: There is no murmur.  ?Edema: ?   Peripheral edema present. ?   Ankle: bilateral trace edema of the ankle. ?Abdominal:  ?   Palpations: Abdomen is soft. There is no hepatomegaly.  ?Skin: ?   General: Skin is warm and dry.  ?Neurological:  ?   General: No focal deficit present.  ?   Mental Status: Alert and oriented to person, place and time.  ?   Cranial Nerves: Cranial nerves are intact.  ?  ?    ?ASSESSMENT & PLAN:   ?Coronary artery disease involving native coronary artery of native heart without angina pectoris ?History of prior PCI to the RCA with subsequent inferior MI in 2009 secondary to very late stent thrombosis treated with angioplasty.  His last PCI was in 2014 and he underwent placement of a DES to the PDA secondary to in-stent restenosis. Myoview in Feb 22 was low risk.  He is doing well without angina.  He has remained on long term dual antiplatelet Rx.  He seems to be tolerating this well. ?Continue ASA 81 mg once daily, Plavix 75 mg once daily, Simvastatin 40 mg once daily. ?F/u 1 year.  ? ?Cardiomyopathy, ischemic ?EF was previously 45 and improved to normal. EF on Myoview in Feb 22 was normal.  BP runs on the low side. Therefore he is not on beta-blocker, ACE/ARB.  He has no signs or symptoms of clinic HF.  ? ?Hyperlipidemia LDL goal <70 ?LDL optimal in Aug 22.  Continue Simvastatin 40 mg once daily.  Plan f/u  fasting CMET, Lipids in 6 mos.  ? ?     ?   ?Dispo:  Return in about 1 year (around 11/25/2022) for Routine Follow Up, w/ Dr. Excell Seltzer, or Tereso Newcomer, PA-C.  ? ?Medication  Adjustments/Labs and Tests Ordered: ?Current medicines are reviewed at length with the patient today.  Concerns regarding medicines are outlined above.  ?Tests Ordered: ?Orders Placed This Encounter  ?Procedures  ? Comprehensive metabolic panel  ? Lipid panel  ? EKG 12-Lead  ? ?Medication Changes: ?Meds ordered this encounter  ?Medications  ? clopidogrel (PLAVIX) 75 MG tablet  ?  Sig: Take 1 tablet (75 mg total) by mouth daily.  ?  Dispense:  90 tablet  ?  Refill:  3  ? ?Signed, ?Tereso Newcomer, PA-C  ?11/24/2021 3:20 PM    ?[facility] ?[address] [place], [place], [place]  [address] ?Phone: [phone]; Fax: [phone]  ?

## 2021-11-24 NOTE — Assessment & Plan Note (Signed)
History of prior PCI to the RCA with subsequent inferior MI in 2009 secondary to very late stent thrombosis treated with angioplasty.  His last PCI was in 2014 and he underwent placement of a DES to the PDA secondary to in-stent restenosis. Myoview in Feb 22 was low risk.  He is doing well without angina.  He has remained on long term dual antiplatelet Rx.  He seems to be tolerating this well. ?? Continue ASA 81 mg once daily, Plavix 75 mg once daily, Simvastatin 40 mg once daily. ?? F/u 1 year.  ?

## 2022-03-18 DIAGNOSIS — S51812A Laceration without foreign body of left forearm, initial encounter: Secondary | ICD-10-CM | POA: Diagnosis not present

## 2022-04-08 DIAGNOSIS — Z1159 Encounter for screening for other viral diseases: Secondary | ICD-10-CM | POA: Diagnosis not present

## 2022-04-08 DIAGNOSIS — E1159 Type 2 diabetes mellitus with other circulatory complications: Secondary | ICD-10-CM | POA: Diagnosis not present

## 2022-04-08 DIAGNOSIS — Z0000 Encounter for general adult medical examination without abnormal findings: Secondary | ICD-10-CM | POA: Diagnosis not present

## 2022-04-08 DIAGNOSIS — Z7984 Long term (current) use of oral hypoglycemic drugs: Secondary | ICD-10-CM | POA: Diagnosis not present

## 2022-04-08 DIAGNOSIS — I2510 Atherosclerotic heart disease of native coronary artery without angina pectoris: Secondary | ICD-10-CM | POA: Diagnosis not present

## 2022-04-08 DIAGNOSIS — I700 Atherosclerosis of aorta: Secondary | ICD-10-CM | POA: Diagnosis not present

## 2022-04-08 DIAGNOSIS — E7800 Pure hypercholesterolemia, unspecified: Secondary | ICD-10-CM | POA: Diagnosis not present

## 2022-04-08 DIAGNOSIS — Z125 Encounter for screening for malignant neoplasm of prostate: Secondary | ICD-10-CM | POA: Diagnosis not present

## 2022-05-27 ENCOUNTER — Other Ambulatory Visit: Payer: Medicare PPO

## 2022-11-30 NOTE — Progress Notes (Deleted)
  Cardiology Office Note:    Date:  11/30/2022  ID:  [name], DOB 04-Nov-1945, MRN [id number] [place] Providers Cardiologist:  Sherren Mocha, MD Cardiology APP:  Liliane Shi, PA-C { Click to update primary MD,subspecialty MD or APP then REFRESH:1}     Patient Profile:   Coronary artery disease  S/p remote stenting to RCA S/p inf MI in 2009 2/2 very late stent thrombosis s/p POBA to RCA S/p 2.5 x 16 mm DES to PDA in 2014 2/2 ISR  Cath: LM 20-30, pLaD 20, RCA stents patent Myoview Feb 2020: low risk  Myoview 10/16/20: low risk, no ischemia, EF 64 Ischemic CM >> EF originally 45 >> improved to normal. PVCs, Supraventricular Tachycardia  Monitor 10/2018: NSR, avg 87, occ PVCs, rare short runs of NSVT, occ short paroxysms of SVT Diabetes mellitus  Hyperlipidemia  Orthostatic hypotension     History of Present Illness:   [name] is a 77 y.o. male returns for f/u on CAD. He was last seen 11/24/21. ***  ROS ***    Studies Reviewed:    EKG:  ***  ***  Risk Assessment/Calculations:   {Does this patient have ATRIAL FIBRILLATION?[phone]} No BP recorded.  {Refresh Note OR Click here to enter BP  :1}***       Physical Exam:   VS:  There were no vitals taken for this visit.   Wt Readings from Last 3 Encounters:  11/24/21 154 lb 9.6 oz (70.1 kg)  10/16/20 163 lb (73.9 kg)  10/09/20 163 lb 6.4 oz (74.1 kg)    Physical Exam***    ASSESSMENT AND PLAN:   No problem-specific Assessment & Plan notes found for this encounter. ***{ Coronary artery disease involving native coronary artery of native heart without angina pectoris History of prior PCI to the RCA with subsequent inferior MI in 2009 secondary to very late stent thrombosis treated with angioplasty.  His last PCI was in 2014 and he underwent placement of a DES to the PDA secondary to in-stent restenosis. Myoview in Feb 22 was low risk.  He is doing well without angina.  He has remained on long term dual  antiplatelet Rx.  He seems to be tolerating this well. Continue ASA 81 mg once daily, Plavix 75 mg once daily, Simvastatin 40 mg once daily. F/u 1 year.    Cardiomyopathy, ischemic EF was previously 45 and improved to normal. EF on Myoview in Feb 22 was normal.  BP runs on the low side. Therefore he is not on beta-blocker, ACE/ARB.  He has no signs or symptoms of clinic HF.    Hyperlipidemia LDL goal <70 LDL optimal in Aug 22.  Continue Simvastatin 40 mg once daily.  Plan f/u fasting CMET, Lipids in 6 mos.      :1}     {Are you ordering a CV Procedure (e.g. stress test, cath, DCCV, TEE, etc)?   Press F2        :[id number]  Dispo:  No follow-ups on file. Signed, Richardson Dopp, PA-C

## 2022-12-01 ENCOUNTER — Ambulatory Visit: Payer: Medicare PPO | Admitting: Physician Assistant

## 2022-12-01 DIAGNOSIS — I2510 Atherosclerotic heart disease of native coronary artery without angina pectoris: Secondary | ICD-10-CM

## 2022-12-08 NOTE — Progress Notes (Unsigned)
  Cardiology Office Note:    Date:  12/08/2022  ID:  Woodroe Mode, DOB October 08, 1945, MRN [id number] [facility] Providers Cardiologist:  Tonny Bollman, MD Cardiology APP:  Beatrice Lecher, PA-C  { Click to update primary MD,subspecialty MD or APP then REFRESH:1}     Patient Profile:   Coronary artery disease  S/p remote stenting to RCA S/p inf MI in 2009 2/2 very late stent thrombosis s/p POBA to RCA S/p 2.5 x 16 mm DES to PDA in 2014 2/2 ISR  Cath: LM 20-30, pLaD 20, RCA stents patent Myoview Feb 2020: low risk  Myoview 10/16/20: low risk, no ischemia, EF 64 Ischemic CM >> EF originally 45 >> improved to normal. PVCs, Supraventricular Tachycardia  Monitor 10/2018: NSR, avg 87, occ PVCs, rare short runs of NSVT, occ short paroxysms of SVT Diabetes mellitus  Hyperlipidemia  Orthostatic hypotension     History of Present Illness:   [name] is a 77 y.o. male returns for f/u on CAD. He was last seen 11/24/21. ***  ROS ***    Studies Reviewed:    EKG:  *** ***  Risk Assessment/Calculations:   {Does this patient have ATRIAL FIBRILLATION?[phone]} No BP recorded.  {Refresh Note OR Click here to enter BP  :1}***       Physical Exam:   VS:  There were no vitals taken for this visit.   Wt Readings from Last 3 Encounters:  11/24/21 154 lb 9.6 oz (70.1 kg)  10/16/20 163 lb (73.9 kg)  10/09/20 163 lb 6.4 oz (74.1 kg)    Physical Exam***    ASSESSMENT AND PLAN:   No problem-specific Assessment & Plan notes found for this encounter. ***{ Coronary artery disease involving native coronary artery of native heart without angina pectoris History of prior PCI to the RCA with subsequent inferior MI in 2009 secondary to very late stent thrombosis treated with angioplasty.  His last PCI was in 2014 and he underwent placement of a DES to the PDA secondary to in-stent restenosis. Myoview in Feb 22 was low risk.  He is doing well without angina.  He has remained on long term dual  antiplatelet Rx.  He seems to be tolerating this well. Continue ASA 81 mg once daily, Plavix 75 mg once daily, Simvastatin 40 mg once daily. F/u 1 year.    Cardiomyopathy, ischemic EF was previously 45 and improved to normal. EF on Myoview in Feb 22 was normal.  BP runs on the low side. Therefore he is not on beta-blocker, ACE/ARB.  He has no signs or symptoms of clinic HF.    Hyperlipidemia LDL goal <70 LDL optimal in Aug 22.  Continue Simvastatin 40 mg once daily.  Plan f/u fasting CMET, Lipids in 6 mos.      :1}     {Are you ordering a CV Procedure (e.g. stress test, cath, DCCV, TEE, etc)?   Press F2        :[id number]}  Dispo:  No follow-ups on file. Signed, Tereso Newcomer, PA-C

## 2022-12-09 ENCOUNTER — Ambulatory Visit: Payer: Medicare PPO | Attending: Physician Assistant | Admitting: Physician Assistant

## 2022-12-09 ENCOUNTER — Encounter: Payer: Self-pay | Admitting: Physician Assistant

## 2022-12-09 VITALS — BP 120/63 | HR 62 | Ht 72.0 in | Wt 155.8 lb

## 2022-12-09 DIAGNOSIS — I951 Orthostatic hypotension: Secondary | ICD-10-CM | POA: Insufficient documentation

## 2022-12-09 DIAGNOSIS — E785 Hyperlipidemia, unspecified: Secondary | ICD-10-CM | POA: Diagnosis not present

## 2022-12-09 DIAGNOSIS — I2510 Atherosclerotic heart disease of native coronary artery without angina pectoris: Secondary | ICD-10-CM

## 2022-12-09 NOTE — Assessment & Plan Note (Signed)
LDL was above goal in August 2023.  His goal is at least less than 70, ideally less than 55.  Continue simvastatin 40 mg daily.  He will have follow-up labs again in August.  If his LDL remains above 70, I will consider changing simvastatin to high intensity rosuvastatin versus adding ezetimibe.

## 2022-12-09 NOTE — Assessment & Plan Note (Signed)
History of remote stenting to the RCA, inferior MI in 2009 secondary to the very late stent thrombosis treated with balloon angioplasty to the RCA and subsequent DES to the PDA in 2014.  Myoview in 2022 was low risk.  He is doing well without chest pain or shortness of breath to suggest angina.  In the warmer months, he typically walks 100 miles per month.  He has been doing well with dual antiplatelet therapy.  Continue ASA 81 mg daily, Plavix 75 mg daily, simvastatin 40 mg daily.  Follow-up 1 year.

## 2022-12-09 NOTE — Assessment & Plan Note (Signed)
I have encouraged him to use compression hose as well as to hydrate well.

## 2022-12-09 NOTE — Patient Instructions (Signed)
Medication Instructions:  Your physician recommends that you continue on your current medications as directed. Please refer to the Current Medication list given to you today.  *If you need a refill on your cardiac medications before your next appointment, please call your pharmacy*   Lab Work: None ordered  If you have labs (blood work) drawn today and your tests are completely normal, you will receive your results only by: MyChart Message (if you have MyChart) OR A paper copy in the mail If you have any lab test that is abnormal or we need to change your treatment, we will call you to review the results.   Testing/Procedures: None ordered   Follow-Up: At [facility], you and your health needs are our priority.  As part of our continuing mission to provide you with exceptional heart care, we have created designated Provider Care Teams.  These Care Teams include your primary Cardiologist (physician) and Advanced Practice Providers (APPs -  Physician Assistants and Nurse Practitioners) who all work together to provide you with the care you need, when you need it.  We recommend signing up for the patient portal called "MyChart".  Sign up information is provided on this After Visit Summary.  MyChart is used to connect with patients for Virtual Visits (Telemedicine).  Patients are able to view lab/test results, encounter notes, upcoming appointments, etc.  Non-urgent messages can be sent to your provider as well.   To learn more about what you can do with MyChart, go to [email or web address].    Your next appointment:   1 year(s)  Provider:   Michael Cooper, MD  or Scott Weaver, PA-C         Other Instructions 

## 2022-12-10 DIAGNOSIS — E119 Type 2 diabetes mellitus without complications: Secondary | ICD-10-CM | POA: Diagnosis not present

## 2022-12-10 DIAGNOSIS — H35373 Puckering of macula, bilateral: Secondary | ICD-10-CM | POA: Diagnosis not present

## 2022-12-10 DIAGNOSIS — H524 Presbyopia: Secondary | ICD-10-CM | POA: Diagnosis not present

## 2022-12-10 DIAGNOSIS — H35411 Lattice degeneration of retina, right eye: Secondary | ICD-10-CM | POA: Diagnosis not present

## 2022-12-10 DIAGNOSIS — H2513 Age-related nuclear cataract, bilateral: Secondary | ICD-10-CM | POA: Diagnosis not present

## 2022-12-10 DIAGNOSIS — H52223 Regular astigmatism, bilateral: Secondary | ICD-10-CM | POA: Diagnosis not present

## 2022-12-10 DIAGNOSIS — H43813 Vitreous degeneration, bilateral: Secondary | ICD-10-CM | POA: Diagnosis not present

## 2022-12-13 ENCOUNTER — Other Ambulatory Visit: Payer: Self-pay | Admitting: Physician Assistant

## 2023-04-20 DIAGNOSIS — I700 Atherosclerosis of aorta: Secondary | ICD-10-CM | POA: Diagnosis not present

## 2023-04-20 DIAGNOSIS — Z0000 Encounter for general adult medical examination without abnormal findings: Secondary | ICD-10-CM | POA: Diagnosis not present

## 2023-04-20 DIAGNOSIS — E1151 Type 2 diabetes mellitus with diabetic peripheral angiopathy without gangrene: Secondary | ICD-10-CM | POA: Diagnosis not present

## 2023-04-20 DIAGNOSIS — I2510 Atherosclerotic heart disease of native coronary artery without angina pectoris: Secondary | ICD-10-CM | POA: Diagnosis not present

## 2023-04-20 DIAGNOSIS — E7800 Pure hypercholesterolemia, unspecified: Secondary | ICD-10-CM | POA: Diagnosis not present

## 2023-04-20 DIAGNOSIS — E1159 Type 2 diabetes mellitus with other circulatory complications: Secondary | ICD-10-CM | POA: Diagnosis not present

## 2023-04-20 DIAGNOSIS — Z125 Encounter for screening for malignant neoplasm of prostate: Secondary | ICD-10-CM | POA: Diagnosis not present

## 2023-04-21 ENCOUNTER — Encounter: Payer: Self-pay | Admitting: Family Medicine

## 2023-04-21 LAB — LAB REPORT - SCANNED
A1c: 6.3
EGFR: 86

## 2023-05-06 ENCOUNTER — Telehealth: Payer: Self-pay | Admitting: *Deleted

## 2023-05-06 NOTE — Telephone Encounter (Signed)
-----   Message from Tereso Newcomer sent at 05/06/2023  8:08 AM EDT ----- Regarding: Change Simvastatin to Rosuvastatin Reviewed labs from PCP done 04/20/23 (via [facility]).  LDL 79 which is above goal. PLAN:  -DC Simvastatin -Start Rosuvastatin 40 mg once daily -Fasting Lipids, ALT in 3 mos. Tereso Newcomer, PA-C    05/06/2023 8:09 AM

## 2023-05-06 NOTE — Telephone Encounter (Signed)
Call placed to pt regarding message below.  Left a message for pt to call back.  

## 2023-05-11 ENCOUNTER — Encounter: Payer: Self-pay | Admitting: *Deleted

## 2023-05-11 DIAGNOSIS — E785 Hyperlipidemia, unspecified: Secondary | ICD-10-CM

## 2023-05-11 MED ORDER — ROSUVASTATIN CALCIUM 40 MG PO TABS: 40.0000 mg | ORAL_TABLET | Freq: Every day | ORAL | 3 refills | Status: DC

## 2023-05-11 NOTE — Telephone Encounter (Signed)
2nd attempt to reach pt regarding message below.  Left another message for pt to call back.  Will also send mychart message.

## 2023-05-11 NOTE — Telephone Encounter (Signed)
Pt responded via my chart

## 2023-08-05 DIAGNOSIS — E118 Type 2 diabetes mellitus with unspecified complications: Secondary | ICD-10-CM

## 2023-08-05 NOTE — Telephone Encounter (Signed)
Ok to add A1c to labs. Ordered placed. Tereso Newcomer, PA-C    08/05/2023 10:50 PM

## 2023-08-06 ENCOUNTER — Other Ambulatory Visit: Payer: Self-pay | Admitting: *Deleted

## 2023-08-06 DIAGNOSIS — E118 Type 2 diabetes mellitus with unspecified complications: Secondary | ICD-10-CM

## 2023-08-13 ENCOUNTER — Ambulatory Visit: Payer: Medicare PPO

## 2023-08-13 DIAGNOSIS — E785 Hyperlipidemia, unspecified: Secondary | ICD-10-CM | POA: Diagnosis not present

## 2023-08-14 LAB — ALT: ALT: 26 [IU]/L (ref 0–44)

## 2023-08-14 LAB — LIPID PANEL
Chol/HDL Ratio: 1.9 {ratio} (ref 0.0–5.0)
Cholesterol, Total: 115 mg/dL (ref 100–199)
HDL: 59 mg/dL (ref >39)
LDL Chol Calc (NIH): 44 mg/dL (ref 0–99)
Triglycerides: 49 mg/dL (ref 0–149)
VLDL Cholesterol Cal: 12 mg/dL (ref 5–40)

## 2023-08-14 LAB — HGB A1C W/O EAG: Hgb A1c MFr Bld: 6.3 % — ABNORMAL HIGH (ref 4.8–5.6)

## 2023-10-25 DIAGNOSIS — E1159 Type 2 diabetes mellitus with other circulatory complications: Secondary | ICD-10-CM | POA: Diagnosis not present

## 2023-10-25 DIAGNOSIS — I2510 Atherosclerotic heart disease of native coronary artery without angina pectoris: Secondary | ICD-10-CM | POA: Diagnosis not present

## 2023-10-25 DIAGNOSIS — E7800 Pure hypercholesterolemia, unspecified: Secondary | ICD-10-CM | POA: Diagnosis not present

## 2023-12-06 ENCOUNTER — Encounter: Payer: Self-pay | Admitting: Cardiovascular Disease

## 2023-12-06 ENCOUNTER — Ambulatory Visit: Payer: Medicare PPO | Attending: Cardiovascular Disease | Admitting: Cardiovascular Disease

## 2023-12-06 VITALS — BP 120/80 | HR 66 | Ht 72.0 in | Wt 153.4 lb

## 2023-12-06 DIAGNOSIS — I2510 Atherosclerotic heart disease of native coronary artery without angina pectoris: Secondary | ICD-10-CM | POA: Diagnosis not present

## 2023-12-06 DIAGNOSIS — E782 Mixed hyperlipidemia: Secondary | ICD-10-CM

## 2023-12-06 NOTE — Assessment & Plan Note (Signed)
 Most recent lipids reviewed with an LDL cholesterol of 44.  Continue rosuvastatin 40 mg daily.  Continue lifestyle modification and he is doing a great job with his diet and exercise regimen.

## 2023-12-06 NOTE — Progress Notes (Signed)
 Cardiology Office Note:    Date:  12/06/2023   ID:  [name], DOB [birth year], MRN [id number]  PCP:  Joycelyn Rua, MD   [facility] Providers Cardiologist:  Tonny Bollman, MD Cardiology APP:  Kennon Rounds     Referring MD: Joycelyn Rua, MD   Chief Complaint  Patient presents with   Coronary Artery Disease    History of Present Illness:    [name] is a 78 y.o. male with a hx of:  Coronary artery disease  S/p remote stenting to RCA S/p inf MI in 2009 2/2 very late stent thrombosis s/p POBA to RCA S/p 2.5 x 16 mm DES to PDA in 2014 2/2 ISR  Cath: LM 20-30, pLaD 20, RCA stents patent Myoview Feb 2020: low risk  Myoview 10/16/20: low risk, no ischemia, EF 64 Ischemic CM >> EF originally 45 >> improved to normal. PVCs, Supraventricular Tachycardia  Monitor 10/2018: NSR, avg 87, occ PVCs, rare short runs of NSVT, occ short paroxysms of SVT Diabetes mellitus  Hyperlipidemia  Orthostatic hypotension   The patient is here alone today. He's doing very well from a cardiac perspective. Today, he denies symptoms of palpitations, chest pain, shortness of breath, orthopnea, PND, lower extremity edema, dizziness, or syncope. He walks regularly and is frequently walking over 5 miles/day. He denies any exertional symptoms.  He was switched from simvastatin to rosuvastatin for more potent lipid-lowering and he had a nice response with most recent LDL cholesterol 44.  Current Medications: Current Meds  Medication Sig   acetaminophen (TYLENOL) 650 MG CR tablet Take 650 mg by mouth every 8 (eight) hours as needed for pain.   aspirin 81 MG tablet Take 1 tablet (81 mg total) by mouth daily.   clopidogrel (PLAVIX) 75 MG tablet Take 1 tablet by mouth once daily   metFORMIN (GLUCOPHAGE) 500 MG tablet Take 2 tablets by mouth daily with breakfast.   Omega-3 Fatty Acids (OMEGA-3 FISH OIL PO) Take 2.4 g by mouth daily.     Allergies:   Beta adrenergic blockers and  Metoprolol   ROS:   Please see the history of present illness.    All other systems reviewed and are negative.  EKGs/Labs/Other Studies Reviewed:    The following studies were reviewed today: Cardiac Studies & Procedures   ______________________________________________________________________________________________   STRESS TESTS  MYOCARDIAL PERFUSION IMAGING 10/16/2020  Narrative  The left ventricular ejection fraction is normal (55-65%).  Nuclear stress EF: 64%.  There was no ST segment deviation noted during stress.  There is a medium, fixed defect of moderate severity present in the basal inferior, mid inferior and apical inferior location. May represent infarct vs artifact due to diaphragmatic attenuation.  Findings consistent with prior stress images in 2017 and 2020.  This is a low risk study.  Laurance Flatten, MD      MONITORS  LONG TERM MONITOR (3-14 DAYS) 10/11/2018  Narrative 1.  The basic rhythm is normal sinus with an average heart rate of 87 bpm 2. There are occasional PVCs and rare, short runs of NSVT up to 8 beats 3. There are occasional paroxysms of SVT, short runs 4. No evidence of atrial fibrillation or flutter 5. No pathologic pauses or bradycardic events 6. No sustained arrhythmias       ______________________________________________________________________________________________      EKG:        Recent Labs: 08/13/2023: ALT 26  Recent Lipid Panel    Component Value Date/Time  CHOL 115 08/13/2023 0850   TRIG 49 08/13/2023 0850   HDL 59 08/13/2023 0850   CHOLHDL 1.9 08/13/2023 0850   CHOLHDL 3 09/25/2014 0830   VLDL 17.6 09/25/2014 0830   LDLCALC 44 08/13/2023 0850     Risk Assessment/Calculations:                Physical Exam:    VS:  BP 120/80   Pulse 66   Ht 6' (1.829 m)   Wt 153 lb 6.4 oz (69.6 kg)   SpO2 98%   BMI 20.80 kg/m     Wt Readings from Last 3 Encounters:  12/06/23 153 lb 6.4 oz (69.6 kg)   12/09/22 155 lb 12.8 oz (70.7 kg)  11/24/21 154 lb 9.6 oz (70.1 kg)     GEN:  Well nourished, well developed in no acute distress HEENT: Normal NECK: No JVD; No carotid bruits LYMPHATICS: No lymphadenopathy CARDIAC: RRR, no murmurs, rubs, gallops RESPIRATORY:  Clear to auscultation without rales, wheezing or rhonchi  ABDOMEN: Soft, non-tender, non-distended MUSCULOSKELETAL:  No edema; No deformity  SKIN: Warm and dry NEUROLOGIC:  Alert and oriented x 3 PSYCHIATRIC:  Normal affect   Assessment & Plan Coronary artery disease involving native coronary artery of native heart without angina pectoris The patient is asymptomatic on his current regimen which includes DAPT with aspirin and clopidogrel after a remote history of very late stent thrombosis.  He is tolerating DAPT without bleeding problems.  He has done very well on rosuvastatin 40 mg for lipid lowering.  He is physically active, walking for exercise with no exertional symptoms.  He will continue current management. Mixed hyperlipidemia Most recent lipids reviewed with an LDL cholesterol of 44.  Continue rosuvastatin 40 mg daily.  Continue lifestyle modification and he is doing a great job with his diet and exercise regimen.      Medication Adjustments/Labs and Tests Ordered: Current medicines are reviewed at length with the patient today.  Concerns regarding medicines are outlined above.  Orders Placed This Encounter  Procedures   EKG 12-Lead   No orders of the defined types were placed in this encounter.   Patient Instructions  Follow-Up: At [facility], you and your health needs are our priority.  As part of our continuing mission to provide you with exceptional heart care, our providers are all part of one team.  This team includes your primary Cardiologist (physician) and Advanced Practice Providers or APPs (Physician Assistants and Nurse Practitioners) who all work together to provide you with the care you  need, when you need it.  Your next appointment:   1 year(s)  Provider:   Tonny Bollman, MD           1st Floor: - Lobby - Registration  - Pharmacy  - Lab - Cafe  2nd Floor: - PV Lab - Diagnostic Testing (echo, CT, nuclear med)  3rd Floor: - Vacant  4th Floor: - TCTS (cardiothoracic surgery) - AFib Clinic - Structural Heart Clinic - Vascular Surgery  - Vascular Ultrasound  5th Floor: - HeartCare Cardiology (general and EP) - Clinical Pharmacy for coumadin, hypertension, lipid, weight-loss medications, and med management appointments    Valet parking services will be available as well.     Signed, Tonny Bollman, MD  12/06/2023 5:22 PM    [facility]

## 2023-12-06 NOTE — Patient Instructions (Signed)
 Follow-Up: At [facility], you and your health needs are our priority.  As part of our continuing mission to provide you with exceptional heart care, our providers are all part of one team.  This team includes your primary Cardiologist (physician) and Advanced Practice Providers or APPs (Physician Assistants and Nurse Practitioners) who all work together to provide you with the care you need, when you need it.  Your next appointment:   1 year(s)  Provider:   Tonny Bollman, MD        1st Floor: - Lobby - Registration  - Pharmacy  - Lab - Cafe  2nd Floor: - PV Lab - Diagnostic Testing (echo, CT, nuclear med)  3rd Floor: - Vacant  4th Floor: - TCTS (cardiothoracic surgery) - AFib Clinic - Structural Heart Clinic - Vascular Surgery  - Vascular Ultrasound  5th Floor: - HeartCare Cardiology (general and EP) - Clinical Pharmacy for coumadin, hypertension, lipid, weight-loss medications, and med management appointments    Valet parking services will be available as well.

## 2023-12-06 NOTE — Assessment & Plan Note (Signed)
 The patient is asymptomatic on his current regimen which includes DAPT with aspirin and clopidogrel after a remote history of very late stent thrombosis.  He is tolerating DAPT without bleeding problems.  He has done very well on rosuvastatin 40 mg for lipid lowering.  He is physically active, walking for exercise with no exertional symptoms.  He will continue current management.

## 2023-12-13 DIAGNOSIS — H35411 Lattice degeneration of retina, right eye: Secondary | ICD-10-CM | POA: Diagnosis not present

## 2023-12-13 DIAGNOSIS — H2513 Age-related nuclear cataract, bilateral: Secondary | ICD-10-CM | POA: Diagnosis not present

## 2023-12-13 DIAGNOSIS — H52223 Regular astigmatism, bilateral: Secondary | ICD-10-CM | POA: Diagnosis not present

## 2023-12-13 DIAGNOSIS — H5213 Myopia, bilateral: Secondary | ICD-10-CM | POA: Diagnosis not present

## 2023-12-13 DIAGNOSIS — H43813 Vitreous degeneration, bilateral: Secondary | ICD-10-CM | POA: Diagnosis not present

## 2023-12-13 DIAGNOSIS — E119 Type 2 diabetes mellitus without complications: Secondary | ICD-10-CM | POA: Diagnosis not present

## 2023-12-13 DIAGNOSIS — H524 Presbyopia: Secondary | ICD-10-CM | POA: Diagnosis not present

## 2023-12-25 ENCOUNTER — Other Ambulatory Visit: Payer: Self-pay | Admitting: Physician Assistant

## 2024-04-24 DIAGNOSIS — Z0000 Encounter for general adult medical examination without abnormal findings: Secondary | ICD-10-CM | POA: Diagnosis not present

## 2024-04-24 DIAGNOSIS — J069 Acute upper respiratory infection, unspecified: Secondary | ICD-10-CM | POA: Diagnosis not present

## 2024-04-24 DIAGNOSIS — R351 Nocturia: Secondary | ICD-10-CM | POA: Diagnosis not present

## 2024-04-24 DIAGNOSIS — E7800 Pure hypercholesterolemia, unspecified: Secondary | ICD-10-CM | POA: Diagnosis not present

## 2024-04-24 DIAGNOSIS — I2510 Atherosclerotic heart disease of native coronary artery without angina pectoris: Secondary | ICD-10-CM | POA: Diagnosis not present

## 2024-04-24 DIAGNOSIS — Z6820 Body mass index (BMI) 20.0-20.9, adult: Secondary | ICD-10-CM | POA: Diagnosis not present

## 2024-04-24 DIAGNOSIS — E1159 Type 2 diabetes mellitus with other circulatory complications: Secondary | ICD-10-CM | POA: Diagnosis not present

## 2024-04-24 DIAGNOSIS — Z1331 Encounter for screening for depression: Secondary | ICD-10-CM | POA: Diagnosis not present

## 2024-05-10 ENCOUNTER — Other Ambulatory Visit: Payer: Self-pay | Admitting: Physician Assistant

## 2024-12-06 ENCOUNTER — Ambulatory Visit: Admitting: Physician Assistant
